# Patient Record
Sex: Female | Born: 2000 | Race: Black or African American | Hispanic: No | Marital: Single | Smoking: Never smoker
Health system: Southern US, Community
[De-identification: ages and names within clinical notes are randomized; demographics above are authoritative.]

## PROBLEM LIST (undated history)

## (undated) DIAGNOSIS — J189 Pneumonia, unspecified organism: Secondary | ICD-10-CM

## (undated) DIAGNOSIS — R011 Cardiac murmur, unspecified: Secondary | ICD-10-CM

## (undated) HISTORY — DX: Cardiac murmur, unspecified: R01.1

## (undated) HISTORY — PX: DILATION AND CURETTAGE OF UTERUS: SHX78

## (undated) HISTORY — DX: Pneumonia, unspecified organism: J18.9

## (undated) HISTORY — PX: MANDIBLE SURGERY: SHX707

---
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
## 2019-01-14 ENCOUNTER — Emergency Department (HOSPITAL_COMMUNITY)
Admission: EM | Admit: 2019-01-14 | Discharge: 2019-01-15 | Disposition: A | Discharge status: Home / Self Care | Payer: Medicaid Other | Attending: Emergency Medicine | Admitting: Emergency Medicine

## 2019-01-14 ENCOUNTER — Encounter (HOSPITAL_COMMUNITY): Payer: Self-pay

## 2019-01-14 DIAGNOSIS — K5000 Crohn's disease of small intestine without complications: Secondary | ICD-10-CM | POA: Insufficient documentation

## 2019-01-14 DIAGNOSIS — Z79899 Other long term (current) drug therapy: Secondary | ICD-10-CM | POA: Insufficient documentation

## 2019-01-14 DIAGNOSIS — R1084 Generalized abdominal pain: Secondary | ICD-10-CM | POA: Diagnosis present

## 2019-01-14 LAB — URINALYSIS, ROUTINE W REFLEX MICROSCOPIC
Bilirubin Urine: NEGATIVE
Glucose, UA: NEGATIVE mg/dL
Ketones, ur: NEGATIVE mg/dL
Leukocytes, UA: NEGATIVE
Nitrite: NEGATIVE
PH: 6 (ref 5.0–8.0)
Protein, ur: NEGATIVE mg/dL
SPECIFIC GRAVITY, URINE: 1.024 (ref 1.005–1.030)

## 2019-01-14 LAB — COMPREHENSIVE METABOLIC PANEL
ALBUMIN: 4.1 g/dL (ref 3.5–5.0)
ALT: 19 U/L (ref 0–44)
AST: 19 U/L (ref 15–41)
Alkaline Phosphatase: 61 U/L (ref 38–126)
Anion gap: 8 (ref 5–15)
BUN: 6 mg/dL (ref 6–20)
CHLORIDE: 108 mmol/L (ref 98–111)
CO2: 23 mmol/L (ref 22–32)
CREATININE: 0.57 mg/dL (ref 0.44–1.00)
Calcium: 9 mg/dL (ref 8.9–10.3)
GFR calc Af Amer: >60 mL/min (ref >60)
GLUCOSE: 83 mg/dL (ref 70–99)
POTASSIUM: 3.7 mmol/L (ref 3.5–5.1)
SODIUM: 139 mmol/L (ref 135–145)
Total Bilirubin: 0.8 mg/dL (ref 0.3–1.2)
Total Protein: 6.6 g/dL (ref 6.5–8.1)

## 2019-01-14 LAB — I-STAT BETA HCG BLOOD, ED (MC, WL, AP ONLY): I-stat hCG, quantitative: <5 m[IU]/mL (ref <5)

## 2019-01-14 LAB — CBC
HCT: 41.2 % (ref 36.0–46.0)
HEMOGLOBIN: 12.8 g/dL (ref 12.0–15.0)
MCH: 27.7 pg (ref 26.0–34.0)
MCHC: 31.1 g/dL (ref 30.0–36.0)
MCV: 89.2 fL (ref 80.0–100.0)
Platelets: 190 10*3/uL (ref 150–400)
RBC: 4.62 MIL/uL (ref 3.87–5.11)
RDW: 12.7 % (ref 11.5–15.5)
WBC: 5.2 10*3/uL (ref 4.0–10.5)
nRBC: 0 % (ref 0.0–0.2)

## 2019-01-14 LAB — LIPASE, BLOOD: LIPASE: 27 U/L (ref 11–51)

## 2019-01-14 MED ORDER — MORPHINE SULFATE (PF) 4 MG/ML IV SOLN
4.0000 mg | Freq: Once | INTRAVENOUS | Status: AC
  Administered 2019-01-14: 4 mg via INTRAVENOUS
  Filled 2019-01-14: qty 1

## 2019-01-14 MED ORDER — SODIUM CHLORIDE 0.9% FLUSH
3.0000 mL | Freq: Once | INTRAVENOUS | Status: AC
  Administered 2019-01-14: 3 mL via INTRAVENOUS

## 2019-01-14 MED ORDER — ONDANSETRON HCL 4 MG/2ML IJ SOLN
4.0000 mg | Freq: Once | INTRAMUSCULAR | Status: AC
  Administered 2019-01-14: 4 mg via INTRAVENOUS
  Filled 2019-01-14: qty 2

## 2019-01-14 NOTE — ED Triage Notes (Signed)
Patient complains of lower abdominal intermittent pain and vomiting for several weeks. Denies diarrhea. No distress on arrival, no vomiting

## 2019-01-14 NOTE — ED Notes (Signed)
Pt reports abd pains for 2 weeks. Pt states she does not have a PCP here in [place] and was [facility] who advised there was nothing they could do for her.

## 2019-01-14 NOTE — ED Provider Notes (Signed)
[facility] EMERGENCY DEPARTMENT Provider Note   CSN: [id number] Arrival date & time: 01/14/19  1454     History   Chief Complaint Chief Complaint  Patient presents with  . Abdominal Pain    HPI Kendra Young is a 18 y.o. female.  HPI Patient presents to the emergency department with generalized abdominal pain that started 1 week ago.  Patient states that she was feeling some discomfort about 2 weeks ago but then noted worsening pain 1 week ago.  The patient states that she was seen by her on campus [facility] and they did not have any answers.  Patient states she has had nausea and vomiting over the last 2 days.  Patient states that nothing seems to make the condition better or worse.  The patient denies chest pain, shortness of breath, headache,blurred vision, neck pain, fever, cough, weakness, numbness, dizziness, anorexia, edema,  diarrhea, rash, back pain, dysuria, hematemesis, bloody stool, near syncope, or syncope. History reviewed. No pertinent past medical history.  There are no active problems to display for this patient.   Past Surgical History:  Procedure Laterality Date  . DILATION AND CURETTAGE OF UTERUS    . MANDIBLE SURGERY       OB History   No obstetric history on file.      Home Medications    Prior to Admission medications   Medication Sig Start Date End Date Taking? Authorizing Provider  etonogestrel (NEXPLANON) 68 MG IMPL implant 1 each by Subdermal route once. November 2019   Yes [provider]    Family History No family history on file.  Social History Social History   Tobacco Use  . Smoking status: Never Smoker  . Smokeless tobacco: Never Used  Substance Use Topics  . Alcohol use: Never    Frequency: Never  . Drug use: Yes    Frequency: 2.0 times per week    Types: Marijuana     Allergies   Patient has no known allergies.   Review of Systems Review of Systems All other systems negative  except as documented in the HPI. All pertinent positives and negatives as reviewed in the HPI.  Physical Exam Updated Vital Signs BP 128/88   Pulse 74   Temp 98.7 F (37.1 C) (Oral)   Resp 16   SpO2 100%   Physical Exam Vitals signs and nursing note reviewed.  Constitutional:      General: She is not in acute distress.    Appearance: She is well-developed.  HENT:     Head: Normocephalic and atraumatic.  Eyes:     Pupils: Pupils are equal, round, and reactive to light.  Neck:     Musculoskeletal: Normal range of motion and neck supple.  Cardiovascular:     Rate and Rhythm: Normal rate and regular rhythm.     Heart sounds: Normal heart sounds. No murmur. No friction rub. No gallop.   Pulmonary:     Effort: Pulmonary effort is normal. No respiratory distress.     Breath sounds: Normal breath sounds. No wheezing.  Abdominal:     General: Bowel sounds are normal. There is no distension.     Palpations: Abdomen is soft.     Tenderness: There is generalized abdominal tenderness. There is no guarding or rebound.  Skin:    General: Skin is warm and dry.     Capillary Refill: Capillary refill takes less than 2 seconds.     Findings: No erythema or rash.  Neurological:     Mental Status: She is alert and oriented to person, place, and time.     Motor: No abnormal muscle tone.     Coordination: Coordination normal.  Psychiatric:        Behavior: Behavior normal.      ED Treatments / Results  Labs (all labs ordered are listed, but only abnormal results are displayed) Labs Reviewed  URINALYSIS, ROUTINE W REFLEX MICROSCOPIC - Abnormal; Notable for the following components:      Result Value   APPearance HAZY (*)    Hgb urine dipstick LARGE (*)    Bacteria, UA RARE (*)    All other components within normal limits  LIPASE, BLOOD  COMPREHENSIVE METABOLIC PANEL  CBC  I-STAT BETA HCG BLOOD, ED ([facility], [facility], [facility] ONLY)    EKG None  Radiology No results  found.  Procedures Procedures (including critical care time)  Medications Ordered in ED Medications  morphine 4 MG/ML injection 4 mg (has no administration in time range)  sodium chloride flush (NS) 0.9 % injection 3 mL (3 mLs Intravenous Given 01/14/19 2251)  ondansetron (ZOFRAN) injection 4 mg (4 mg Intravenous Given 01/14/19 2245)     Initial Impression / Assessment and Plan / ED Course  I have reviewed the triage vital signs and the nursing notes.  Pertinent labs & imaging results that were available during my care of the patient were reviewed by me and considered in my medical decision making (see chart for details).     Patient has diffuse abdominal pain on examination.  Will have a CT scan to evaluate for any further issues.  If this is negative we will treat her symptoms and refer to GI.  She has no definite vaginal discharge but states that she has had bleeding since she got Nexplanon implanted.  Final Clinical Impressions(s) / ED Diagnoses   Final diagnoses:  None    ED Discharge Orders    None       Kyra MangesLawyer, Ohanna Gassert, PA-C 01/14/19 2345    Arby BarrettePfeiffer, Marcy, MD 01/15/19 2003

## 2019-01-15 ENCOUNTER — Emergency Department (HOSPITAL_COMMUNITY): Payer: Medicaid Other

## 2019-01-15 ENCOUNTER — Encounter (HOSPITAL_COMMUNITY): Payer: Self-pay | Admitting: Radiology

## 2019-01-15 MED ORDER — DICYCLOMINE HCL 20 MG PO TABS: 20.0000 mg | ORAL_TABLET | Freq: Two times a day (BID) | ORAL | 0 refills | Status: DC | PRN

## 2019-01-15 MED ORDER — AMOXICILLIN-POT CLAVULANATE 875-125 MG PO TABS: 1.0000 | ORAL_TABLET | Freq: Two times a day (BID) | ORAL | 0 refills | Status: DC

## 2019-01-15 MED ORDER — DICYCLOMINE HCL 10 MG PO CAPS
10.0000 mg | ORAL_CAPSULE | Freq: Once | ORAL | Status: AC
  Administered 2019-01-15: 10 mg via ORAL
  Filled 2019-01-15: qty 1

## 2019-01-15 MED ORDER — IOHEXOL 300 MG/ML  SOLN
100.0000 mL | Freq: Once | INTRAMUSCULAR | Status: AC | PRN
  Administered 2019-01-15: 100 mL via INTRAVENOUS

## 2019-01-15 NOTE — Discharge Instructions (Signed)
Mild your CT scan showed some inflammation to the end of your small intestine.  This may be from infectious or inflammatory causes.  You have been started on Augmentin to try and manage any potential exposure infectious cause of symptoms.  Continue use of Zofran, you which you were previously prescribed, for management of nausea.  You may use Bentyl as prescribed for abdominal pain or cramping.  Drink plenty of liquids to prevent dehydration.  We advise follow-up with a gastroenterologist.

## 2019-01-15 NOTE — ED Provider Notes (Signed)
12:57 AM Patient care assumed from [facility], PA-C at change of shift.  Patient presenting for abdominal pain, pending imaging.  I have reviewed the patient's CT scan which shows findings of terminal ileitis without complication.  This may be infectious or inflammatory.  She has no known history of IBD.  Plan for referral to gastroenterology.  Will trial on a course of Augmentin in the interim.  Patient states that she was given Zofran by student health.  Will provide additional prescription for Bentyl to manage abdominal pain and cramping.  Advised continued hydration with return if symptoms worsen.  Patient discharged in stable condition with no unaddressed concerns.  Vitals:   01/14/19 1520 01/14/19 2237 01/14/19 2300 01/14/19 2330  BP: 115/71 128/88 118/81 121/74  Pulse: 65 74 62 86  Resp: 16 16    Temp: 98.7 F (37.1 C)     TempSrc: Oral     SpO2: 100% 100% 100% 100%   Results for orders placed or performed during the hospital encounter of 01/14/19  Lipase, blood  Result Value Ref Range   Lipase 27 11 - 51 U/L  Comprehensive metabolic panel  Result Value Ref Range   Sodium 139 135 - 145 mmol/L   Potassium 3.7 3.5 - 5.1 mmol/L   Chloride 108 98 - 111 mmol/L   CO2 23 22 - 32 mmol/L   Glucose, Bld 83 70 - 99 mg/dL   BUN 6 6 - 20 mg/dL   Creatinine, Ser 4.09 0.44 - 1.00 mg/dL   Calcium 9.0 8.9 - 81.1 mg/dL   Total Protein 6.6 6.5 - 8.1 g/dL   Albumin 4.1 3.5 - 5.0 g/dL   AST 19 15 - 41 U/L   ALT 19 0 - 44 U/L   Alkaline Phosphatase 61 38 - 126 U/L   Total Bilirubin 0.8 0.3 - 1.2 mg/dL   GFR calc non Af Amer >60 >60 mL/min   GFR calc Af Amer >60 >60 mL/min   Anion gap 8 5 - 15  CBC  Result Value Ref Range   WBC 5.2 4.0 - 10.5 K/uL   RBC 4.62 3.87 - 5.11 MIL/uL   Hemoglobin 12.8 12.0 - 15.0 g/dL   HCT 91.4 78.2 - 95.6 %   MCV 89.2 80.0 - 100.0 fL   MCH 27.7 26.0 - 34.0 pg   MCHC 31.1 30.0 - 36.0 g/dL   RDW 21.3 08.6 - 57.8 %   Platelets 190 150 - 400 K/uL   nRBC  0.0 0.0 - 0.2 %  Urinalysis, Routine w reflex microscopic  Result Value Ref Range   Color, Urine YELLOW YELLOW   APPearance HAZY (A) CLEAR   Specific Gravity, Urine 1.024 1.005 - 1.030   pH 6.0 5.0 - 8.0   Glucose, UA NEGATIVE NEGATIVE mg/dL   Hgb urine dipstick LARGE (A) NEGATIVE   Bilirubin Urine NEGATIVE NEGATIVE   Ketones, ur NEGATIVE NEGATIVE mg/dL   Protein, ur NEGATIVE NEGATIVE mg/dL   Nitrite NEGATIVE NEGATIVE   Leukocytes, UA NEGATIVE NEGATIVE   RBC / HPF 0-5 0 - 5 RBC/hpf   WBC, UA 0-5 0 - 5 WBC/hpf   Bacteria, UA RARE (A) NONE SEEN   Squamous Epithelial / LPF 6-10 0 - 5   Mucus PRESENT   I-Stat beta hCG blood, ED  Result Value Ref Range   I-stat hCG, quantitative <5.0 <5 mIU/mL   Comment 3           Ct Abdomen Pelvis W Contrast  Result Date: 01/15/2019 CLINICAL DATA:  Abdominal pain with nausea and vomiting EXAM: CT ABDOMEN AND PELVIS WITH CONTRAST TECHNIQUE: Multidetector CT imaging of the abdomen and pelvis was performed using the standard protocol following bolus administration of intravenous contrast. CONTRAST:  OMNIPAQUE IOHEXOL 300 MG/ML  SOLN COMPARISON:  None. FINDINGS: Lower chest: Lung bases demonstrate no acute consolidation or effusion. Normal heart size. Hepatobiliary: No focal liver abnormality is seen. No gallstones, gallbladder wall thickening, or biliary dilatation. Pancreas: Unremarkable. No pancreatic ductal dilatation or surrounding inflammatory changes. Spleen: Normal in size without focal abnormality. Adrenals/Urinary Tract: Adrenal glands are unremarkable. Kidneys are normal, without renal calculi, focal lesion, or hydronephrosis. Bladder is unremarkable. Stomach/Bowel: Stomach is within normal limits. No dilated small bowel. Mild terminal ileal and ileocecal region bowel wall thickening. Vascular/Lymphatic: No significant vascular findings are present. No enlarged abdominal or pelvic lymph nodes. Reproductive: Uterus and bilateral adnexa are  unremarkable. Other: Negative for free air or free fluid Musculoskeletal: No acute or significant osseous findings. IMPRESSION: 1. Thickening of the terminal ileum and ileocecal valve, suspect for terminal ileitis as may be seen with infection or inflammatory bowel disease. 2. Otherwise no CT evidence for acute intra-abdominal or pelvic abnormality Electronically Signed   By: Jasmine Pang M.D.   On: 01/15/2019 00:39      Antony Madura, PA-C 01/15/19 5009    Arby Barrette, MD 01/15/19 2002

## 2019-01-15 NOTE — ED Notes (Signed)
Patient verbalizes understanding of discharge instructions. Opportunity for questioning and answers were provided. Armband removed by staff, pt discharged from ED home via friend.

## 2019-01-21 ENCOUNTER — Encounter (HOSPITAL_COMMUNITY): Payer: Self-pay

## 2019-01-21 ENCOUNTER — Emergency Department (HOSPITAL_COMMUNITY)
Admission: EM | Admit: 2019-01-21 | Discharge: 2019-01-21 | Disposition: A | Discharge status: Home / Self Care | Payer: Medicaid Other | Attending: Emergency Medicine | Admitting: Emergency Medicine

## 2019-01-21 DIAGNOSIS — Z79899 Other long term (current) drug therapy: Secondary | ICD-10-CM | POA: Insufficient documentation

## 2019-01-21 DIAGNOSIS — K5000 Crohn's disease of small intestine without complications: Secondary | ICD-10-CM | POA: Insufficient documentation

## 2019-01-21 DIAGNOSIS — R1030 Lower abdominal pain, unspecified: Secondary | ICD-10-CM | POA: Diagnosis present

## 2019-01-21 LAB — CBC WITH DIFFERENTIAL/PLATELET
ABS IMMATURE GRANULOCYTES: 0.01 10*3/uL (ref 0.00–0.07)
Basophils Absolute: 0 10*3/uL (ref 0.0–0.1)
Basophils Relative: 1 %
Eosinophils Absolute: 0 10*3/uL (ref 0.0–0.5)
Eosinophils Relative: 1 %
HCT: 42.1 % (ref 36.0–46.0)
HEMOGLOBIN: 13.3 g/dL (ref 12.0–15.0)
IMMATURE GRANULOCYTES: 0 %
Lymphocytes Relative: 47 %
Lymphs Abs: 2.3 10*3/uL (ref 0.7–4.0)
MCH: 27.8 pg (ref 26.0–34.0)
MCHC: 31.6 g/dL (ref 30.0–36.0)
MCV: 87.9 fL (ref 80.0–100.0)
Monocytes Absolute: 0.2 10*3/uL (ref 0.1–1.0)
Monocytes Relative: 5 %
Neutro Abs: 2.2 10*3/uL (ref 1.7–7.7)
Neutrophils Relative %: 46 %
Platelets: 220 10*3/uL (ref 150–400)
RBC: 4.79 MIL/uL (ref 3.87–5.11)
RDW: 12.5 % (ref 11.5–15.5)
WBC: 4.8 10*3/uL (ref 4.0–10.5)
nRBC: 0 % (ref 0.0–0.2)

## 2019-01-21 MED ORDER — PREDNISONE 10 MG (21) PO TBPK: ORAL_TABLET | Freq: Every day | ORAL | 0 refills | Status: AC

## 2019-01-21 MED ORDER — DICYCLOMINE HCL 20 MG PO TABS: 20.0000 mg | ORAL_TABLET | Freq: Two times a day (BID) | ORAL | 0 refills | Status: DC

## 2019-01-21 MED ORDER — DICYCLOMINE HCL 10 MG PO CAPS
20.0000 mg | ORAL_CAPSULE | Freq: Once | ORAL | Status: AC
  Administered 2019-01-21: 20 mg via ORAL
  Filled 2019-01-21: qty 2

## 2019-01-21 MED ORDER — PREDNISONE 20 MG PO TABS
40.0000 mg | ORAL_TABLET | Freq: Once | ORAL | Status: AC
  Administered 2019-01-21: 40 mg via ORAL
  Filled 2019-01-21: qty 2

## 2019-01-21 NOTE — ED Triage Notes (Signed)
Pt reports continued lower abd pain, pt was seen here a couple weeks ago and was told she has inflammation in her bowels. Pt given pain medication and recommendation to follow up with Gi but states she is unable to see Gi until her primary can give her a referral but her primary is in [place]. Pt states she is out of her medication and the pain is worse.

## 2019-01-21 NOTE — Discharge Instructions (Signed)
Your testing today was very normal.  Please be aware that you will need to follow-up with a gastrointestinal specialist.  I will refer you to the person who is on-call today, this is [facility] gastroenterology, phone number [phone].  Please see the contact information below.  In the meantime please take the prednisone as prescribed over the next 12 days as well as dicyclomine, 20 mg every 6 hours only as needed.  Return to the emergency department for increasing pain, fever, vomiting or bloody stools.

## 2019-01-21 NOTE — ED Provider Notes (Signed)
[place] EMERGENCY DEPARTMENT Provider Note   CSN: [id number] Arrival date & time: 01/21/19  1104     History   Chief Complaint Chief Complaint  Patient presents with  . Abdominal Pain    HPI Kendra Young is a 18 y.o. female.  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  She had been worked up in the emergency department with a combination of lab work and a CT scan.  The CBC and the metabolic panel were totally normal with no leukocytosis or anemia, her CT scan did show what appeared to be terminal ileitis.  She was treated with a course of Augmentin and a course of dicyclomine which she reports helped her symptoms however now that she is out of those medications her symptoms have returned causing increasing pain in the lower belly.  Her appetite is been decreased but she is not vomiting, she has normal stools without blood or mucus.  They are formed.  She denies any associated fevers, swelling, rashes, headaches, blurred vision or any other symptoms.  She has not been able to see the gastroenterologist stating that she has not seen her family doctor who is in [place] to do this referral.  She is requesting additional evaluation and treatment for her ongoing discomfort.  History reviewed. No pertinent past medical history.  There are no active problems to display for this patient.   Past Surgical History:  Procedure Laterality Date  . DILATION AND CURETTAGE OF UTERUS    . MANDIBLE SURGERY       OB History   No obstetric history on file.      Home Medications    Prior to Admission medications   Medication Sig Start Date End Date Taking? Authorizing Provider  dicyclomine (BENTYL) 20 MG tablet Take 1 tablet (20 mg total) by mouth 2 (two) times daily. 01/21/19   Noemi Chapel, MD  etonogestrel (NEXPLANON) 68 MG  IMPL implant 1 each by Subdermal route once. November 2019    [provider]  predniSONE (STERAPRED UNI-PAK 21 TAB) 10 MG (21) TBPK tablet Take by mouth daily for 12 days. Take 6 tabs by mouth daily  for 2 days, then 5 tabs for 2 days, then 4 tabs for 2 days, then 3 tabs for 2 days, 2 tabs for 2 days, then 1 tab by mouth daily for 2 days 01/21/19 02/02/19  Noemi Chapel, MD    Family History No family history on file.  Social History Social History   Tobacco Use  . Smoking status: Never Smoker  . Smokeless tobacco: Never Used  Substance Use Topics  . Alcohol use: Never    Frequency: Never  . Drug use: Yes    Frequency: 2.0 times per week    Types: Marijuana     Allergies   Patient has no known allergies.   Review of Systems Review of Systems  All other systems reviewed and are negative.    Physical Exam Updated Vital Signs BP (!) 105/55 (BP Location: Right Arm)   Pulse 66   Temp 98.5 F (36.9 C) (Oral)   Resp 16   SpO2 100%   Physical Exam Vitals signs and nursing note reviewed.  Constitutional:      General: She is not in acute distress.    Appearance: She is well-developed.  HENT:  Head: Normocephalic and atraumatic.     Mouth/Throat:     Pharynx: No oropharyngeal exudate.  Eyes:     General: No scleral icterus.       Right eye: No discharge.        Left eye: No discharge.     Conjunctiva/sclera: Conjunctivae normal.     Pupils: Pupils are equal, round, and reactive to light.  Neck:     Musculoskeletal: Normal range of motion and neck supple.     Thyroid: No thyromegaly.     Vascular: No JVD.  Cardiovascular:     Rate and Rhythm: Normal rate and regular rhythm.     Heart sounds: Normal heart sounds. No murmur. No friction rub. No gallop.   Pulmonary:     Effort: Pulmonary effort is normal. No respiratory distress.     Breath sounds: Normal breath sounds. No wheezing or rales.  Abdominal:     General: Bowel sounds are normal. There is no  distension.     Palpations: Abdomen is soft. There is no mass.     Tenderness: There is abdominal tenderness ( mild SP and RLQ and LLQ ttp, no guarding, no upper abd ttp).     Comments: Non surgical abd  Musculoskeletal: Normal range of motion.        General: No tenderness.  Lymphadenopathy:     Cervical: No cervical adenopathy.  Skin:    General: Skin is warm and dry.     Findings: No erythema or rash.  Neurological:     Mental Status: She is alert.     Coordination: Coordination normal.  Psychiatric:        Behavior: Behavior normal.      ED Treatments / Results  Labs (all labs ordered are listed, but only abnormal results are displayed) Labs Reviewed  CBC WITH DIFFERENTIAL/PLATELET    EKG None  Radiology No results found.  Procedures Procedures (including critical care time)  Medications Ordered in ED Medications  dicyclomine (BENTYL) capsule 20 mg (20 mg Oral Given 01/21/19 1318)  predniSONE (DELTASONE) tablet 40 mg (40 mg Oral Given 01/21/19 1319)     Initial Impression / Assessment and Plan / ED Course  I have reviewed the triage vital signs and the nursing notes.  Pertinent labs & imaging results that were available during my care of the patient were reviewed by me and considered in my medical decision making (see chart for details).  Clinical Course as of Jan 21 1422  Tue Jan 21, 2019  1419 BC normal, no leukocytosis, patient will be discharged with medications as per the note below, blood pressure stable, abdomen nonsurgical, the absence of leukocytosis suggest that this is not a progressive or surgical illness.  I have again informed her that she needs to follow-up with GI, she is agreeable   [BM]    Clinical Course User Index [BM] Noemi Chapel, MD    The patient is well-appearing, she has no vaginal symptoms, no bleeding, no mucus however she is 18 years old with what appears to be terminal ileitis raising a significant suspicion for inflammatory  bowel disease.  I believe that a repeat CBC is in order to make sure she does not have a significant leukocytosis to suggest another etiology however she would likely benefit from a steroid taper, dicyclomine and again follow-up with gastroenterologist.  The patient is agreeable to this plan and has a nonsurgical abdomen.  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

## 2021-10-18 ENCOUNTER — Emergency Department (HOSPITAL_COMMUNITY)
Admission: EM | Admit: 2021-10-18 | Discharge: 2021-10-19 | Disposition: A | Discharge status: Home / Self Care | Payer: Medicaid Other | Attending: Emergency Medicine | Admitting: Emergency Medicine

## 2021-10-18 ENCOUNTER — Encounter (HOSPITAL_COMMUNITY): Payer: Self-pay | Admitting: Emergency Medicine

## 2021-10-18 ENCOUNTER — Other Ambulatory Visit: Payer: Self-pay

## 2021-10-18 DIAGNOSIS — N9489 Other specified conditions associated with female genital organs and menstrual cycle: Secondary | ICD-10-CM | POA: Insufficient documentation

## 2021-10-18 DIAGNOSIS — R110 Nausea: Secondary | ICD-10-CM | POA: Diagnosis not present

## 2021-10-18 DIAGNOSIS — Z5321 Procedure and treatment not carried out due to patient leaving prior to being seen by health care provider: Secondary | ICD-10-CM | POA: Insufficient documentation

## 2021-10-18 DIAGNOSIS — R109 Unspecified abdominal pain: Secondary | ICD-10-CM | POA: Insufficient documentation

## 2021-10-18 DIAGNOSIS — N938 Other specified abnormal uterine and vaginal bleeding: Secondary | ICD-10-CM | POA: Diagnosis not present

## 2021-10-18 DIAGNOSIS — R42 Dizziness and giddiness: Secondary | ICD-10-CM | POA: Diagnosis not present

## 2021-10-18 LAB — BASIC METABOLIC PANEL
Anion gap: 9 (ref 5–15)
BUN: <5 mg/dL — ABNORMAL LOW (ref 6–20)
CO2: 25 mmol/L (ref 22–32)
Calcium: 8.8 mg/dL — ABNORMAL LOW (ref 8.9–10.3)
Chloride: 104 mmol/L (ref 98–111)
Creatinine, Ser: 0.56 mg/dL (ref 0.44–1.00)
GFR, Estimated: >60 mL/min (ref >60)
Glucose, Bld: 83 mg/dL (ref 70–99)
Potassium: 3.7 mmol/L (ref 3.5–5.1)
Sodium: 138 mmol/L (ref 135–145)

## 2021-10-18 LAB — I-STAT BETA HCG BLOOD, ED (MC, WL, AP ONLY): I-stat hCG, quantitative: <5 m[IU]/mL (ref <5)

## 2021-10-18 LAB — CBC WITH DIFFERENTIAL/PLATELET
Abs Immature Granulocytes: 0.01 10*3/uL (ref 0.00–0.07)
Basophils Absolute: 0 10*3/uL (ref 0.0–0.1)
Basophils Relative: 0 %
Eosinophils Absolute: 0 10*3/uL (ref 0.0–0.5)
Eosinophils Relative: 1 %
HCT: 40.4 % (ref 36.0–46.0)
Hemoglobin: 13 g/dL (ref 12.0–15.0)
Immature Granulocytes: 0 %
Lymphocytes Relative: 30 %
Lymphs Abs: 2.6 10*3/uL (ref 0.7–4.0)
MCH: 28.5 pg (ref 26.0–34.0)
MCHC: 32.2 g/dL (ref 30.0–36.0)
MCV: 88.6 fL (ref 80.0–100.0)
Monocytes Absolute: 0.9 10*3/uL (ref 0.1–1.0)
Monocytes Relative: 10 %
Neutro Abs: 5.1 10*3/uL (ref 1.7–7.7)
Neutrophils Relative %: 59 %
Platelets: 275 10*3/uL (ref 150–400)
RBC: 4.56 MIL/uL (ref 3.87–5.11)
RDW: 12.3 % (ref 11.5–15.5)
WBC: 8.5 10*3/uL (ref 4.0–10.5)
nRBC: 0 % (ref 0.0–0.2)

## 2021-10-18 NOTE — ED Provider Notes (Signed)
Emergency Medicine Provider Triage Evaluation Note  Kendra Young , a 20 y.o. female  was evaluated in triage.  Pt complains of vaginal bleeding over the last 4 days.  She has Nexplanon and normally does not have her cycle.  She has not been sexually active over the last 2 months.  She reports passing some clots and is now having associated shortness of breath, nausea, and lightheadedness.  She denies any urinary complaints, vaginal discharge or foul odor, diarrhea.  Review of Systems  Positive:  Negative: See above   Physical Exam  BP 123/77   Pulse 86   Temp 99 F (37.2 C) (Oral)   Resp 15   Ht 5' (1.524 m)   Wt 47.6 kg   SpO2 100%   BMI 20.51 kg/m  Gen:   Awake, no distress   Resp:  Normal effort  MSK:   Moves extremities without difficulty  Other:  Abdomen is nontender to palpation.  Medical Decision Making  Medically screening exam initiated at 7:43 PM.  Appropriate orders placed.  Kendra Young was informed that the remainder of the evaluation will be completed by another provider, this initial triage assessment does not replace that evaluation, and the importance of remaining in the ED until their evaluation is complete.     Myna Bright [place], PA-C 10/18/21 1945    Lucrezia Starch, MD 10/21/21 [phone]

## 2021-10-18 NOTE — ED Notes (Signed)
Pt states she is leaving  

## 2021-10-18 NOTE — ED Triage Notes (Signed)
Reports very heavy bleeding X4 days.  She states she usually does not having bleeding due to her birthcontrol.  Pt reports nausea and lightheadedness.  No neuro symptoms at this time.  Some cramping at times.

## 2021-10-19 ENCOUNTER — Emergency Department (HOSPITAL_COMMUNITY)
Admission: EM | Admit: 2021-10-19 | Discharge: 2021-10-20 | Disposition: A | Discharge status: Home / Self Care | Payer: Medicaid Other | Attending: Student | Admitting: Student

## 2021-10-19 ENCOUNTER — Emergency Department (HOSPITAL_COMMUNITY): Payer: Medicaid Other

## 2021-10-19 ENCOUNTER — Encounter (HOSPITAL_COMMUNITY): Payer: Self-pay | Admitting: *Deleted

## 2021-10-19 ENCOUNTER — Other Ambulatory Visit: Payer: Self-pay

## 2021-10-19 DIAGNOSIS — J101 Influenza due to other identified influenza virus with other respiratory manifestations: Secondary | ICD-10-CM | POA: Insufficient documentation

## 2021-10-19 DIAGNOSIS — Z20822 Contact with and (suspected) exposure to covid-19: Secondary | ICD-10-CM | POA: Diagnosis not present

## 2021-10-19 DIAGNOSIS — R509 Fever, unspecified: Secondary | ICD-10-CM | POA: Diagnosis present

## 2021-10-19 LAB — COMPREHENSIVE METABOLIC PANEL
ALT: 14 U/L (ref 0–44)
AST: 17 U/L (ref 15–41)
Albumin: 3.5 g/dL (ref 3.5–5.0)
Alkaline Phosphatase: 71 U/L (ref 38–126)
Anion gap: 11 (ref 5–15)
BUN: 6 mg/dL (ref 6–20)
CO2: 25 mmol/L (ref 22–32)
Calcium: 9.1 mg/dL (ref 8.9–10.3)
Chloride: 99 mmol/L (ref 98–111)
Creatinine, Ser: 0.61 mg/dL (ref 0.44–1.00)
GFR, Estimated: >60 mL/min (ref >60)
Glucose, Bld: 123 mg/dL — ABNORMAL HIGH (ref 70–99)
Potassium: 3.6 mmol/L (ref 3.5–5.1)
Sodium: 135 mmol/L (ref 135–145)
Total Bilirubin: 0.8 mg/dL (ref 0.3–1.2)
Total Protein: 7.1 g/dL (ref 6.5–8.1)

## 2021-10-19 LAB — CBC WITH DIFFERENTIAL/PLATELET
Abs Immature Granulocytes: 0.04 10*3/uL (ref 0.00–0.07)
Basophils Absolute: 0 10*3/uL (ref 0.0–0.1)
Basophils Relative: 0 %
Eosinophils Absolute: 0 10*3/uL (ref 0.0–0.5)
Eosinophils Relative: 0 %
HCT: 41.4 % (ref 36.0–46.0)
Hemoglobin: 14 g/dL (ref 12.0–15.0)
Immature Granulocytes: 0 %
Lymphocytes Relative: 20 %
Lymphs Abs: 2.1 10*3/uL (ref 0.7–4.0)
MCH: 28.9 pg (ref 26.0–34.0)
MCHC: 33.8 g/dL (ref 30.0–36.0)
MCV: 85.5 fL (ref 80.0–100.0)
Monocytes Absolute: 0.7 10*3/uL (ref 0.1–1.0)
Monocytes Relative: 7 %
Neutro Abs: 7.9 10*3/uL — ABNORMAL HIGH (ref 1.7–7.7)
Neutrophils Relative %: 73 %
Platelets: 325 10*3/uL (ref 150–400)
RBC: 4.84 MIL/uL (ref 3.87–5.11)
RDW: 11.9 % (ref 11.5–15.5)
WBC: 10.8 10*3/uL — ABNORMAL HIGH (ref 4.0–10.5)
nRBC: 0 % (ref 0.0–0.2)

## 2021-10-19 LAB — RESP PANEL BY RT-PCR (FLU A&B, COVID) ARPGX2
Influenza A by PCR: POSITIVE — AB
Influenza B by PCR: NEGATIVE
SARS Coronavirus 2 by RT PCR: NEGATIVE

## 2021-10-19 LAB — LIPASE, BLOOD: Lipase: 22 U/L (ref 11–51)

## 2021-10-19 MED ORDER — ALBUTEROL SULFATE HFA 108 (90 BASE) MCG/ACT IN AERS
4.0000 | INHALATION_SPRAY | Freq: Once | RESPIRATORY_TRACT | Status: AC
  Administered 2021-10-20: 4 via RESPIRATORY_TRACT
  Filled 2021-10-19: qty 6.7

## 2021-10-19 MED ORDER — ACETAMINOPHEN 500 MG PO TABS
1000.0000 mg | ORAL_TABLET | Freq: Once | ORAL | Status: AC
  Administered 2021-10-20: 1000 mg via ORAL
  Filled 2021-10-19: qty 2

## 2021-10-19 MED ORDER — AEROCHAMBER PLUS FLO-VU LARGE MISC: 1.0000 | Freq: Once | Status: AC

## 2021-10-19 MED ORDER — ONDANSETRON 4 MG PO TBDP
4.0000 mg | ORAL_TABLET | Freq: Once | ORAL | Status: AC
  Administered 2021-10-20: 4 mg via ORAL
  Filled 2021-10-19: qty 1

## 2021-10-19 MED ORDER — IPRATROPIUM-ALBUTEROL 0.5-2.5 (3) MG/3ML IN SOLN: 5.0000 mL | Freq: Once | RESPIRATORY_TRACT | Status: DC

## 2021-10-19 NOTE — ED Provider Notes (Signed)
Emergency Medicine Provider Triage Evaluation Note  Kendra Young , a 20 y.o. female  was evaluated in triage.  Pt complains of nausea, cough, vomiting, body aches, fevers, headache.  She reports vomiting with nausea.    She has had one negative covid test since this started.   She also reports heavy menstrual cycle.  She has irregular cycles, but never one that irregular.    Review of Systems  Positive: Fevers, cough, nausea, vomiting. Sore throat.   Negative: Abdominal pain.   Physical Exam  BP 115/72 (BP Location: Right Arm)   Pulse 83   Temp (!) 103.1 F (39.5 C) (Oral)   Resp 16   Ht 5' (1.524 m)   Wt 47.6 kg   SpO2 99%   BMI 20.51 kg/m  Gen:   Awake, no distress   Resp:  Normal effort coughing,  MSK:   Moves extremities without difficulty  Other:  Normal speech.   Medical Decision Making  Medically screening exam initiated at 8:01 PM.  Appropriate orders placed.  Kendra Young was informed that the remainder of the evaluation will be completed by another provider, this initial triage assessment does not replace that evaluation, and the importance of remaining in the ED until their evaluation is complete.     Kendra Young 10/19/21 2007    Hayden Rasmussen, MD 10/20/21 1020

## 2021-10-19 NOTE — ED Triage Notes (Signed)
PT states headache, cough, vomiting, body aches, fevers x 1.5 weeks.  States headache is getting worse.  Initially, throat was sore, but no longer.  Also c/o heavy menstrual cycle x 5 days, though improved this am.

## 2021-10-19 NOTE — ED Provider Notes (Signed)
[facility] EMERGENCY DEPARTMENT Provider Note   CSN: [id number] Arrival date & time: 10/19/21  1819     History Chief Complaint  Patient presents with   URI    Kendra Young is a 20 y.o. female.  Patient presents to the emergency department with a chief complaint of fever, chills, body aches.  She reports associated nausea, vomiting.  She states that she has had a negative COVID test.  States that she feels short of breath and like her lungs are tight.  She states that she initially got sick about a week ago, symptoms improved, and then worsened over the weekend.  She denies any other associated symptoms.  Denies any successful treatments prior to arrival.  The history is provided by the patient. No language interpreter was used.      History reviewed. No pertinent past medical history.  There are no problems to display for this patient.   Past Surgical History:  Procedure Laterality Date   DILATION AND CURETTAGE OF UTERUS     MANDIBLE SURGERY       OB History   No obstetric history on file.     No family history on file.  Social History   Tobacco Use   Smoking status: Never   Smokeless tobacco: Never  Vaping Use   Vaping Use: Never used  Substance Use Topics   Alcohol use: Never   Drug use: Yes    Frequency: 2.0 times per week    Types: Marijuana    Home Medications Prior to Admission medications   Medication Sig Start Date End Date Taking? Authorizing Provider  dicyclomine (BENTYL) 20 MG tablet Take 1 tablet (20 mg total) by mouth 2 (two) times daily. 01/21/19   Noemi Chapel, MD  etonogestrel (NEXPLANON) 68 MG IMPL implant 1 each by Subdermal route once. November 2019    [provider]    Allergies    Patient has no known allergies.  Review of Systems   Review of Systems  All other systems reviewed and are negative.  Physical Exam Updated Vital Signs BP 115/72 (BP Location: Right Arm)   Pulse 83   Temp (!) 103.1 F  (39.5 C) (Oral)   Resp 16   Ht 5' (1.524 m)   Wt 47.6 kg   SpO2 99%   BMI 20.51 kg/m   Physical Exam Vitals and nursing note reviewed.  Constitutional:      General: She is not in acute distress.    Appearance: She is well-developed.  HENT:     Head: Normocephalic and atraumatic.  Eyes:     Conjunctiva/sclera: Conjunctivae normal.  Cardiovascular:     Rate and Rhythm: Normal rate and regular rhythm.     Heart sounds: No murmur heard. Pulmonary:     Effort: Pulmonary effort is normal. No respiratory distress.     Breath sounds: Wheezing present.  Abdominal:     Palpations: Abdomen is soft.     Tenderness: There is no abdominal tenderness.  Musculoskeletal:        General: Normal range of motion.     Cervical back: Neck supple.  Skin:    General: Skin is warm and dry.  Neurological:     Mental Status: She is alert and oriented to person, place, and time.  Psychiatric:        Mood and Affect: Mood normal.        Behavior: Behavior normal.    ED Results / Procedures / Treatments  Labs (all labs ordered are listed, but only abnormal results are displayed) Labs Reviewed  RESP PANEL BY RT-PCR (FLU A&B, COVID) ARPGX2 - Abnormal; Notable for the following components:      Result Value   Influenza A by PCR POSITIVE (*)    All other components within normal limits  COMPREHENSIVE METABOLIC PANEL - Abnormal; Notable for the following components:   Glucose, Bld 123 (*)    All other components within normal limits  CBC WITH DIFFERENTIAL/PLATELET - Abnormal; Notable for the following components:   WBC 10.8 (*)    Neutro Abs 7.9 (*)    All other components within normal limits  LIPASE, BLOOD  I-STAT BETA HCG BLOOD, ED ([facility], [facility], [facility] ONLY)    EKG None  Radiology DG Chest 2 View  Result Date: 10/19/2021 CLINICAL DATA:  Shortness of breath, cough EXAM: CHEST - 2 VIEW COMPARISON:  None. FINDINGS: Lungs are clear.  No pleural effusion or pneumothorax. The heart is normal in  size. Visualized osseous structures are within normal limits. IMPRESSION: Normal chest radiographs. Electronically Signed   By: Julian Hy M.D.   On: 10/19/2021 20:57    Procedures Procedures   Medications Ordered in ED Medications  ondansetron (ZOFRAN-ODT) disintegrating tablet 4 mg (has no administration in time range)  acetaminophen (TYLENOL) tablet 1,000 mg (has no administration in time range)  ipratropium-albuterol (DUONEB) 0.5-2.5 (3) MG/3ML nebulizer solution 5 mL (has no administration in time range)    ED Course  I have reviewed the triage vital signs and the nursing notes.  Pertinent labs & imaging results that were available during my care of the patient were reviewed by me and considered in my medical decision making (see chart for details).    MDM Rules/Calculators/A&P                           Patient here with flulike symptoms.  Also reports shortness of breath.  She does have significant wheezing on my exam.  We will give nebulizer treatment.  Noted to be febrile to 103.1 in triage.  She is not hypoxic nor tachycardic.  Overall, patient does not appear toxic, but does appear uncomfortable from flu symptoms.  Influenza A test is positive.  COVID and flu B-.  She is outside of the treatment for influenza with Tamiflu.  Will reassess after albuterol treatment.  Patient feels improved after albuterol.  Lung sounds are clear.  Will discharge home with Tessalon and inhaler.  Return precautions discussed. Final Clinical Impression(s) / ED Diagnoses Final diagnoses:  Influenza A    Rx / DC Orders ED Discharge Orders          Ordered    benzonatate (TESSALON) 100 MG capsule  2 times daily PRN        10/20/21 0036             Montine Circle, PA-C 10/20/21 0038    Kommor, [place], MD 10/24/21 0730

## 2021-10-20 MED ORDER — AEROCHAMBER PLUS FLO-VU LARGE MISC
Status: AC
  Administered 2021-10-20: 1
  Filled 2021-10-20: qty 1

## 2021-10-20 MED ORDER — BENZONATATE 100 MG PO CAPS: 100.0000 mg | ORAL_CAPSULE | Freq: Two times a day (BID) | ORAL | 0 refills | Status: DC | PRN

## 2022-05-11 IMAGING — CR DG CHEST 2V
2 series · 2 of 2 positions shown · non-contrast
Comparison: None.

CLINICAL DATA: Shortness of breath, cough

EXAM:
CHEST - 2 VIEW

[chest lat]
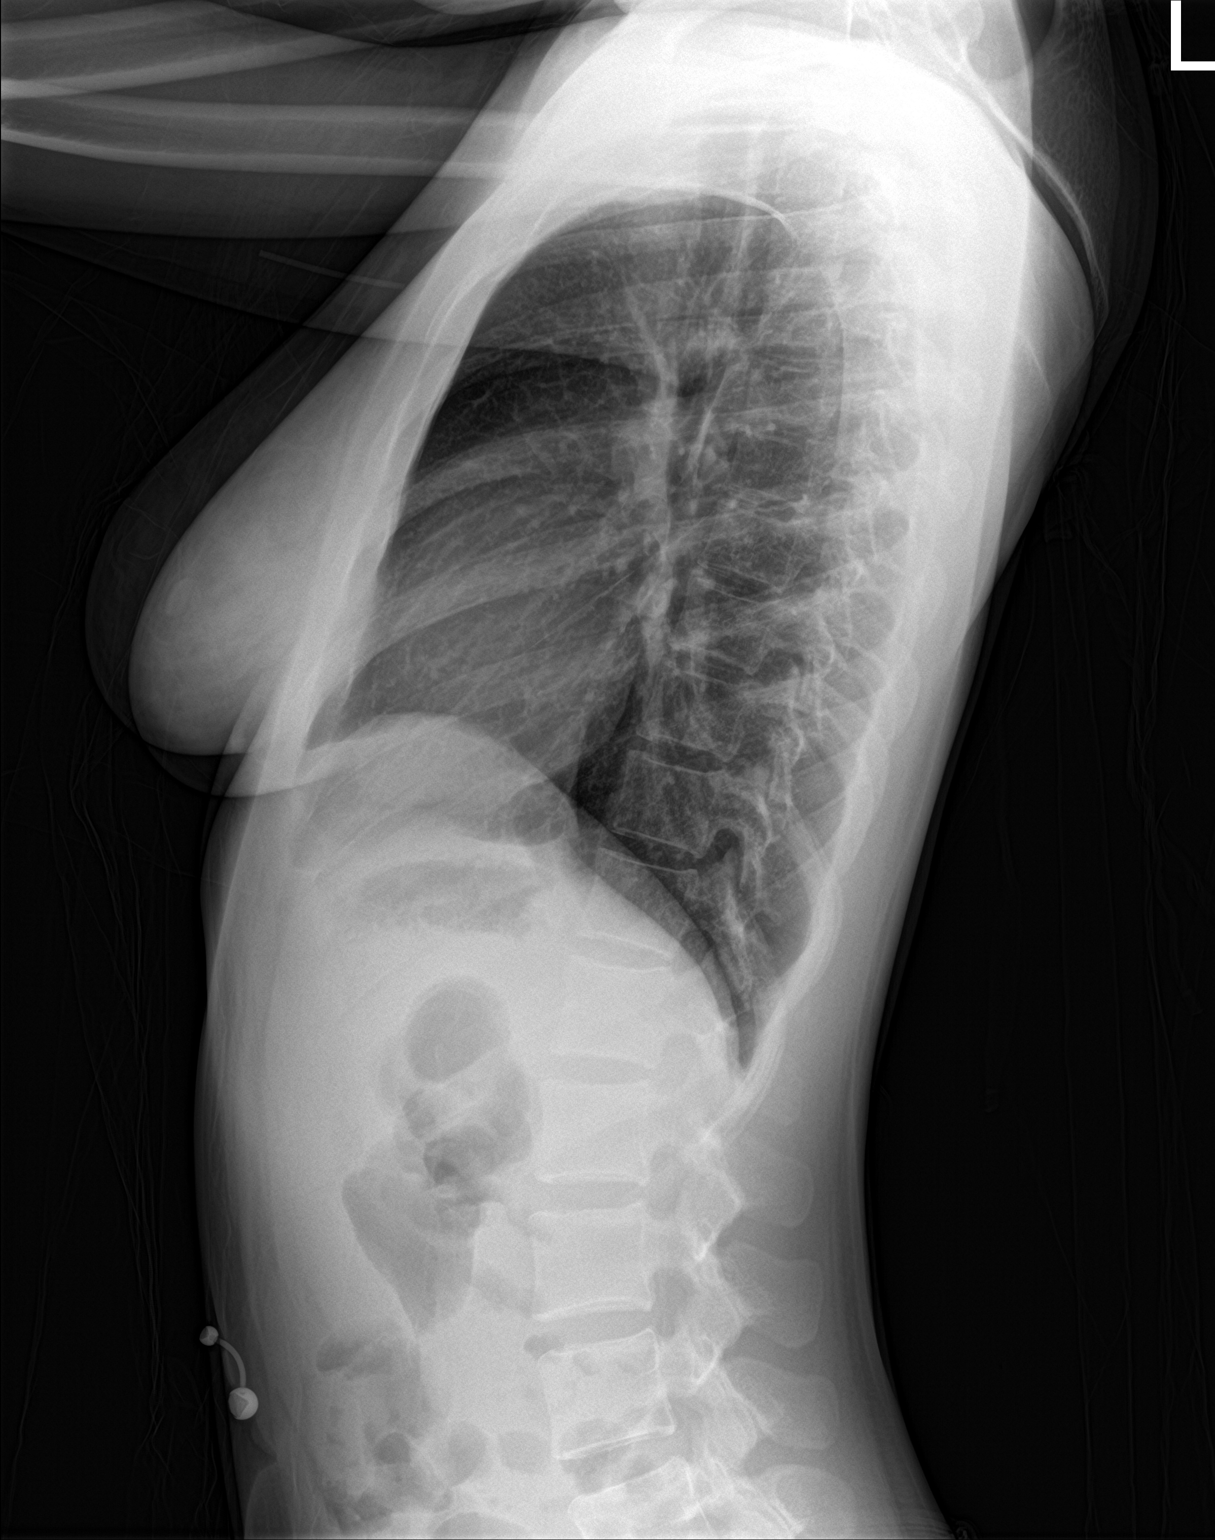

[chest pa]
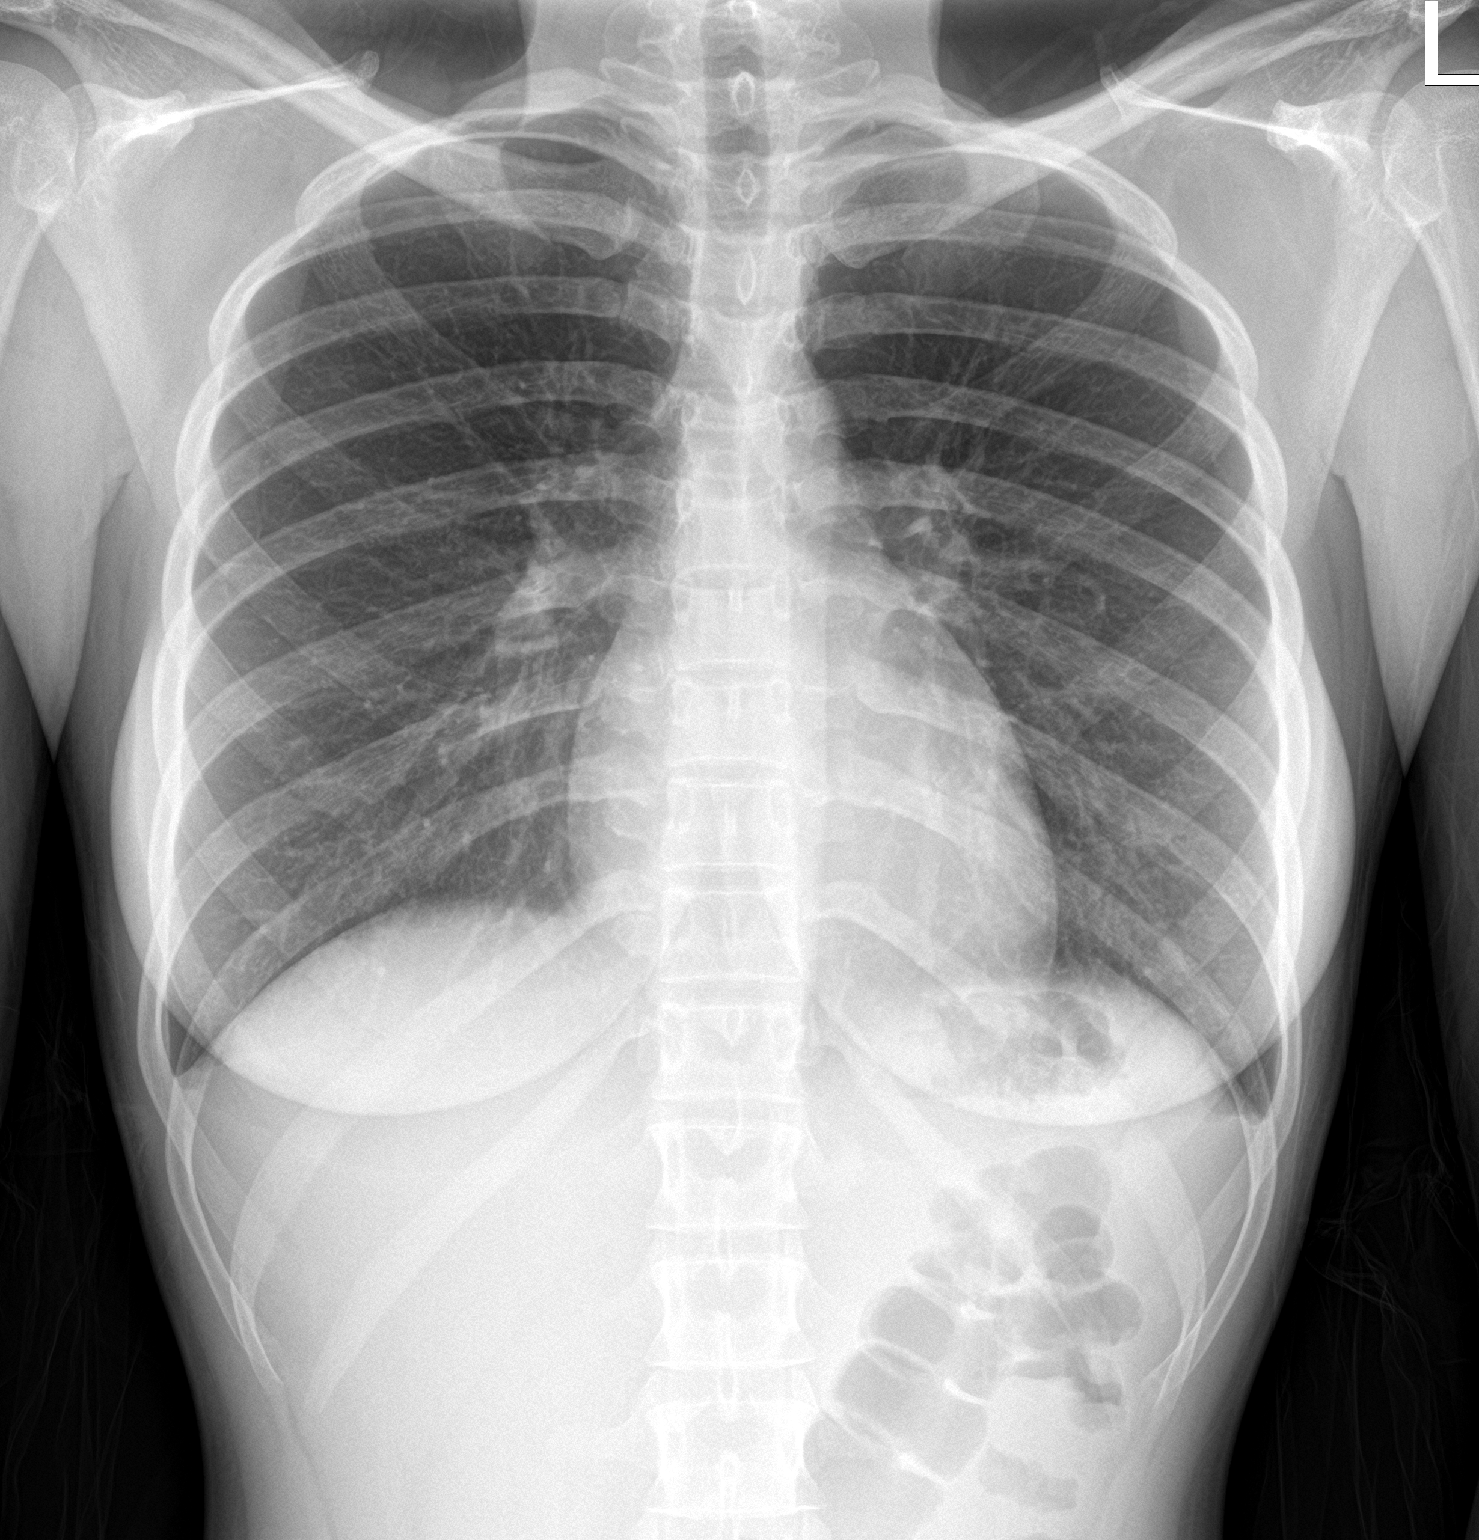

[2 of 2 positions shown; findings below may reference images not displayed]

FINDINGS: Lungs are clear.  No pleural effusion or pneumothorax.

The heart is normal in size.

Visualized osseous structures are within normal limits.
IMPRESSION: Normal chest radiographs.

## 2022-10-24 ENCOUNTER — Emergency Department (HOSPITAL_COMMUNITY): Payer: Medicaid Other

## 2022-10-24 ENCOUNTER — Encounter (HOSPITAL_COMMUNITY): Payer: Self-pay

## 2022-10-24 ENCOUNTER — Other Ambulatory Visit: Payer: Self-pay

## 2022-10-24 ENCOUNTER — Emergency Department (HOSPITAL_COMMUNITY)
Admission: EM | Admit: 2022-10-24 | Discharge: 2022-10-25 | Disposition: A | Discharge status: Home / Self Care | Payer: Medicaid Other | Attending: Emergency Medicine | Admitting: Emergency Medicine

## 2022-10-24 DIAGNOSIS — R519 Headache, unspecified: Secondary | ICD-10-CM | POA: Insufficient documentation

## 2022-10-24 DIAGNOSIS — R110 Nausea: Secondary | ICD-10-CM | POA: Diagnosis not present

## 2022-10-24 DIAGNOSIS — Y92410 Unspecified street and highway as the place of occurrence of the external cause: Secondary | ICD-10-CM | POA: Diagnosis not present

## 2022-10-24 DIAGNOSIS — R079 Chest pain, unspecified: Secondary | ICD-10-CM | POA: Diagnosis present

## 2022-10-24 LAB — CBC WITH DIFFERENTIAL/PLATELET
Abs Immature Granulocytes: 0 10*3/uL (ref 0.00–0.07)
Basophils Absolute: 0 10*3/uL (ref 0.0–0.1)
Basophils Relative: 1 %
Eosinophils Absolute: 0.1 10*3/uL (ref 0.0–0.5)
Eosinophils Relative: 2 %
HCT: 40.3 % (ref 36.0–46.0)
Hemoglobin: 13.2 g/dL (ref 12.0–15.0)
Immature Granulocytes: 0 %
Lymphocytes Relative: 56 %
Lymphs Abs: 3.1 10*3/uL (ref 0.7–4.0)
MCH: 29.4 pg (ref 26.0–34.0)
MCHC: 32.8 g/dL (ref 30.0–36.0)
MCV: 89.8 fL (ref 80.0–100.0)
Monocytes Absolute: 0.3 10*3/uL (ref 0.1–1.0)
Monocytes Relative: 5 %
Neutro Abs: 2 10*3/uL (ref 1.7–7.7)
Neutrophils Relative %: 36 %
Platelets: 230 10*3/uL (ref 150–400)
RBC: 4.49 MIL/uL (ref 3.87–5.11)
RDW: 12.9 % (ref 11.5–15.5)
WBC: 5.5 10*3/uL (ref 4.0–10.5)
nRBC: 0 % (ref 0.0–0.2)

## 2022-10-24 LAB — I-STAT BETA HCG BLOOD, ED (MC, WL, AP ONLY): I-stat hCG, quantitative: <5 m[IU]/mL (ref <5)

## 2022-10-24 LAB — BASIC METABOLIC PANEL
Anion gap: 7 (ref 5–15)
BUN: 8 mg/dL (ref 6–20)
CO2: 24 mmol/L (ref 22–32)
Calcium: 9 mg/dL (ref 8.9–10.3)
Chloride: 107 mmol/L (ref 98–111)
Creatinine, Ser: 0.65 mg/dL (ref 0.44–1.00)
GFR, Estimated: >60 mL/min (ref >60)
Glucose, Bld: 88 mg/dL (ref 70–99)
Potassium: 3.4 mmol/L — ABNORMAL LOW (ref 3.5–5.1)
Sodium: 138 mmol/L (ref 135–145)

## 2022-10-24 MED ORDER — IBUPROFEN 800 MG PO TABS
800.0000 mg | ORAL_TABLET | Freq: Once | ORAL | Status: AC
  Administered 2022-10-24: 800 mg via ORAL
  Filled 2022-10-24: qty 1

## 2022-10-24 MED ORDER — OXYCODONE-ACETAMINOPHEN 5-325 MG PO TABS
1.0000 | ORAL_TABLET | Freq: Once | ORAL | Status: AC
  Administered 2022-10-24: 1 via ORAL
  Filled 2022-10-24: qty 1
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
## 2022-10-25 ENCOUNTER — Emergency Department (HOSPITAL_COMMUNITY): Payer: Medicaid Other

## 2022-10-25 MED ORDER — ONDANSETRON HCL 4 MG PO TABS: 4.0000 mg | ORAL_TABLET | Freq: Four times a day (QID) | ORAL | 0 refills | Status: DC

## 2022-10-25 MED ORDER — IOHEXOL 300 MG/ML  SOLN
75.0000 mL | Freq: Once | INTRAMUSCULAR | Status: AC | PRN
  Administered 2022-10-25: 75 mL via INTRAVENOUS
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
## 2022-10-26 ENCOUNTER — Ambulatory Visit (INDEPENDENT_AMBULATORY_CARE_PROVIDER_SITE_OTHER): Payer: Medicaid Other | Admitting: Sports Medicine

## 2022-10-26 VITALS — Ht 61.0 in | Wt 108.0 lb

## 2022-10-26 DIAGNOSIS — M542 Cervicalgia: Secondary | ICD-10-CM

## 2022-10-26 DIAGNOSIS — R112 Nausea with vomiting, unspecified: Secondary | ICD-10-CM

## 2022-10-26 DIAGNOSIS — S060X0A Concussion without loss of consciousness, initial encounter: Secondary | ICD-10-CM

## 2022-10-26 MED ORDER — MELOXICAM 15 MG PO TABS: 15.0000 mg | ORAL_TABLET | Freq: Every day | ORAL | 0 refills | Status: DC

## 2022-10-26 NOTE — Patient Instructions (Addendum)
Relative mental and physical rest for 48 hours after concussive event -           Recommend light aerobic activity while keeping symptoms less than 3/10 -           Stop mental or physical activities that cause symptoms to worsen greater than 3/10, and wait 24 hours before attempting them again -           Eliminate screen time as much as possible for first 48 hours after concussive event, then continue limited screen time (recommend less than 2 hours per day) - Start meloxicam 15 mg daily x2 weeks.  If still having pain after 2 weeks, complete 3rd-week of meloxicam. May use remaining meloxicam as needed once daily for pain control.  Do not to use additional NSAIDs while taking meloxicam.  May use Tylenol [phone] mg 2 to 3 times a day for breakthrough pain. Neck HEP  Work note and school note  1 week follow up
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
## 2022-11-03 ENCOUNTER — Ambulatory Visit (INDEPENDENT_AMBULATORY_CARE_PROVIDER_SITE_OTHER): Payer: Medicaid Other | Admitting: Sports Medicine

## 2022-11-03 VITALS — HR 59 | Ht 61.0 in | Wt 108.0 lb

## 2022-11-03 DIAGNOSIS — H818X9 Other disorders of vestibular function, unspecified ear: Secondary | ICD-10-CM | POA: Diagnosis not present

## 2022-11-03 DIAGNOSIS — S060X0A Concussion without loss of consciousness, initial encounter: Secondary | ICD-10-CM | POA: Diagnosis not present

## 2022-11-03 DIAGNOSIS — M542 Cervicalgia: Secondary | ICD-10-CM | POA: Diagnosis not present

## 2022-11-03 DIAGNOSIS — G44319 Acute post-traumatic headache, not intractable: Secondary | ICD-10-CM

## 2022-11-03 DIAGNOSIS — R112 Nausea with vomiting, unspecified: Secondary | ICD-10-CM

## 2022-11-03 NOTE — Patient Instructions (Addendum)
Good to see you School note and work note  1-2 week follow up anytime
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
## 2022-11-14 ENCOUNTER — Ambulatory Visit (INDEPENDENT_AMBULATORY_CARE_PROVIDER_SITE_OTHER): Payer: Medicaid Other | Admitting: Sports Medicine

## 2022-11-14 VITALS — BP 120/80 | HR 57 | Ht 61.0 in | Wt 108.0 lb

## 2022-11-14 DIAGNOSIS — S060X0A Concussion without loss of consciousness, initial encounter: Secondary | ICD-10-CM | POA: Diagnosis not present

## 2022-11-14 DIAGNOSIS — H818X9 Other disorders of vestibular function, unspecified ear: Secondary | ICD-10-CM

## 2022-11-14 DIAGNOSIS — G44319 Acute post-traumatic headache, not intractable: Secondary | ICD-10-CM | POA: Diagnosis not present

## 2022-11-14 DIAGNOSIS — M545 Low back pain, unspecified: Secondary | ICD-10-CM | POA: Diagnosis not present

## 2022-11-14 NOTE — Patient Instructions (Addendum)
Good to see you  Low back HEP  Work note 4 hours max dec 4th 6 hours next 2 week  School note no major testing and can return to class as tolerated  2 week follow up

## 2022-11-22 ENCOUNTER — Other Ambulatory Visit: Payer: Self-pay | Admitting: Sports Medicine
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
## 2022-11-27 ENCOUNTER — Ambulatory Visit (INDEPENDENT_AMBULATORY_CARE_PROVIDER_SITE_OTHER): Payer: Medicaid Other | Admitting: Sports Medicine

## 2022-11-27 VITALS — BP 110/80 | HR 79 | Ht 61.0 in | Wt 107.0 lb

## 2022-11-27 DIAGNOSIS — S060X0A Concussion without loss of consciousness, initial encounter: Secondary | ICD-10-CM

## 2022-11-27 DIAGNOSIS — M545 Low back pain, unspecified: Secondary | ICD-10-CM

## 2022-11-27 DIAGNOSIS — M542 Cervicalgia: Secondary | ICD-10-CM

## 2022-11-27 DIAGNOSIS — H818X9 Other disorders of vestibular function, unspecified ear: Secondary | ICD-10-CM

## 2022-11-27 NOTE — Patient Instructions (Addendum)
Good to see you  School note no restrictions  Pt referral  Work note 6 hours max 1 week 12/04/2022 8 hours 3 week follow up
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
## 2022-12-06 ENCOUNTER — Other Ambulatory Visit: Payer: Self-pay

## 2022-12-06 ENCOUNTER — Ambulatory Visit: Payer: Medicaid Other | Attending: Sports Medicine

## 2022-12-06 DIAGNOSIS — M5459 Other low back pain: Secondary | ICD-10-CM | POA: Insufficient documentation

## 2022-12-06 DIAGNOSIS — M545 Low back pain, unspecified: Secondary | ICD-10-CM | POA: Diagnosis not present

## 2022-12-06 DIAGNOSIS — M542 Cervicalgia: Secondary | ICD-10-CM | POA: Diagnosis present
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
## 2022-12-15 ENCOUNTER — Ambulatory Visit: Payer: Medicaid Other

## 2022-12-15 DIAGNOSIS — M5459 Other low back pain: Secondary | ICD-10-CM | POA: Diagnosis not present

## 2022-12-15 DIAGNOSIS — M542 Cervicalgia: Secondary | ICD-10-CM
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
## 2022-12-27 ENCOUNTER — Ambulatory Visit (INDEPENDENT_AMBULATORY_CARE_PROVIDER_SITE_OTHER): Payer: Medicaid Other | Admitting: Sports Medicine

## 2022-12-27 VITALS — BP 110/78 | HR 60 | Ht 61.0 in | Wt 107.0 lb

## 2022-12-27 DIAGNOSIS — H818X9 Other disorders of vestibular function, unspecified ear: Secondary | ICD-10-CM

## 2022-12-27 DIAGNOSIS — M545 Low back pain, unspecified: Secondary | ICD-10-CM

## 2022-12-27 DIAGNOSIS — S060X0A Concussion without loss of consciousness, initial encounter: Secondary | ICD-10-CM

## 2022-12-27 DIAGNOSIS — M542 Cervicalgia: Secondary | ICD-10-CM

## 2022-12-27 DIAGNOSIS — G44319 Acute post-traumatic headache, not intractable: Secondary | ICD-10-CM

## 2022-12-27 MED ORDER — AMITRIPTYLINE HCL 10 MG PO TABS: 10.0000 mg | ORAL_TABLET | Freq: Every day | ORAL | 0 refills | Status: DC

## 2022-12-27 NOTE — Patient Instructions (Signed)
Good to see you Work note provided  6 hours maximum Start amitriptyline 10 mg nightly 1 week and if no negative side effects increase to 20 mg nightly  3 week follow up

## 2022-12-28 ENCOUNTER — Ambulatory Visit: Payer: Medicaid Other

## 2023-01-03 ENCOUNTER — Ambulatory Visit: Payer: Medicaid Other | Attending: Sports Medicine

## 2023-01-03 DIAGNOSIS — M5459 Other low back pain: Secondary | ICD-10-CM | POA: Diagnosis not present

## 2023-01-03 DIAGNOSIS — M542 Cervicalgia: Secondary | ICD-10-CM | POA: Insufficient documentation
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
## 2023-01-11 ENCOUNTER — Ambulatory Visit: Payer: Medicaid Other

## 2023-01-11 DIAGNOSIS — M5459 Other low back pain: Secondary | ICD-10-CM

## 2023-01-11 DIAGNOSIS — M542 Cervicalgia: Secondary | ICD-10-CM
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
## 2023-01-30 ENCOUNTER — Encounter: Payer: Medicaid Other | Admitting: Sports Medicine
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
## 2023-01-31 ENCOUNTER — Ambulatory Visit (INDEPENDENT_AMBULATORY_CARE_PROVIDER_SITE_OTHER): Payer: Medicaid Other | Admitting: Sports Medicine

## 2023-01-31 VITALS — BP 108/80 | Ht 61.0 in | Wt 104.0 lb

## 2023-01-31 DIAGNOSIS — H818X9 Other disorders of vestibular function, unspecified ear: Secondary | ICD-10-CM | POA: Diagnosis not present

## 2023-01-31 DIAGNOSIS — S060X0A Concussion without loss of consciousness, initial encounter: Secondary | ICD-10-CM

## 2023-01-31 DIAGNOSIS — G44319 Acute post-traumatic headache, not intractable: Secondary | ICD-10-CM

## 2023-01-31 DIAGNOSIS — M542 Cervicalgia: Secondary | ICD-10-CM

## 2023-01-31 DIAGNOSIS — G4700 Insomnia, unspecified: Secondary | ICD-10-CM

## 2023-01-31 NOTE — Patient Instructions (Addendum)
Good to see you  Recommend continue amitriptyline  10 mg daily thorough friday  Then recommend alternating 1 day medication 1 day off over 6 days  And then fully discontinuing medication Cleared to return to work and school no restrictions  2 week follow up

## 2023-02-08 ENCOUNTER — Ambulatory Visit: Payer: Medicaid Other | Attending: Sports Medicine

## 2023-02-08 DIAGNOSIS — M542 Cervicalgia: Secondary | ICD-10-CM

## 2023-02-08 DIAGNOSIS — M5459 Other low back pain: Secondary | ICD-10-CM | POA: Diagnosis present
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
## 2023-02-19 ENCOUNTER — Ambulatory Visit (INDEPENDENT_AMBULATORY_CARE_PROVIDER_SITE_OTHER): Payer: Medicaid Other | Admitting: Sports Medicine

## 2023-02-19 VITALS — BP 120/80 | HR 55 | Ht 61.0 in | Wt 106.0 lb

## 2023-02-19 DIAGNOSIS — S060X0D Concussion without loss of consciousness, subsequent encounter: Secondary | ICD-10-CM

## 2023-02-19 DIAGNOSIS — G44319 Acute post-traumatic headache, not intractable: Secondary | ICD-10-CM | POA: Diagnosis not present

## 2023-02-19 NOTE — Patient Instructions (Signed)
Good to see you   

## 2023-03-07 ENCOUNTER — Telehealth: Payer: Self-pay | Admitting: Sports Medicine

## 2023-03-07 NOTE — Telephone Encounter (Signed)
New school note sent via mychart
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
## 2023-05-30 ENCOUNTER — Ambulatory Visit (INDEPENDENT_AMBULATORY_CARE_PROVIDER_SITE_OTHER): Payer: Medicaid Other | Admitting: Obstetrics

## 2023-05-30 ENCOUNTER — Other Ambulatory Visit (HOSPITAL_COMMUNITY)
Admission: RE | Admit: 2023-05-30 | Discharge: 2023-05-30 | Disposition: A | Discharge status: Home / Self Care | Payer: Medicaid Other | Source: Ambulatory Visit | Attending: Obstetrics | Admitting: Obstetrics

## 2023-05-30 ENCOUNTER — Encounter: Payer: Self-pay | Admitting: Obstetrics

## 2023-05-30 VITALS — BP 104/66 | HR 66 | Ht 61.0 in | Wt 107.0 lb

## 2023-05-30 DIAGNOSIS — Z113 Encounter for screening for infections with a predominantly sexual mode of transmission: Secondary | ICD-10-CM

## 2023-05-30 DIAGNOSIS — N898 Other specified noninflammatory disorders of vagina: Secondary | ICD-10-CM | POA: Diagnosis present

## 2023-05-30 DIAGNOSIS — Z01419 Encounter for gynecological examination (general) (routine) without abnormal findings: Secondary | ICD-10-CM | POA: Insufficient documentation

## 2023-05-30 DIAGNOSIS — Z1339 Encounter for screening examination for other mental health and behavioral disorders: Secondary | ICD-10-CM

## 2023-05-30 NOTE — Progress Notes (Signed)
Subjective:        Kendra Young is a 22 y.o. female here for a routine exam.  Current complaints: Vaginal discharge .    Personal health questionnaire:  Is patient Ashkenazi Jewish, have a family history of breast and/or ovarian cancer: yes Is there a family history of uterine cancer diagnosed at age < 71, gastrointestinal cancer, urinary tract cancer, family member who is a Personnel officer syndrome-associated carrier: no Is the patient overweight and hypertensive, family history of diabetes, personal history of gestational diabetes, preeclampsia or PCOS: no Is patient over 42, have PCOS,  family history of premature CHD under age 2, diabetes, smoke, have hypertension or peripheral artery disease:  no At any time, has a partner hit, kicked or otherwise hurt or frightened you?: no Over the past 2 weeks, have you felt down, depressed or hopeless?: no Over the past 2 weeks, have you felt little interest or pleasure in doing things?:no   Gynecologic History Patient's last menstrual period was 05/05/2023 (exact date). Contraception: abstinence Last Pap: none. Results were: none Last mammogram: n/a. Results were: n/a  Obstetric History OB History  Gravida Para Term Preterm AB Living  2       2 0  SAB IAB Ectopic Multiple Live Births  2            # Outcome Date GA Lbr Len/2nd Weight Sex Delivery Anes PTL Lv  2 SAB           1 SAB             Past Medical History:  Diagnosis Date   Heart murmur    Pneumonia     Past Surgical History:  Procedure Laterality Date   DILATION AND CURETTAGE OF UTERUS     MANDIBLE SURGERY       Current Outpatient Medications:    amitriptyline (ELAVIL) 10 MG tablet, Take 1 tablet (10 mg total) by mouth at bedtime. (Patient not taking: Reported on 05/30/2023), Disp: 30 tablet, Rfl: 0   benzonatate (TESSALON) 100 MG capsule, Take 1 capsule (100 mg total) by mouth 2 (two) times daily as needed for cough. (Patient not taking: Reported on 05/30/2023), Disp:  20 capsule, Rfl: 0   dicyclomine (BENTYL) 20 MG tablet, Take 1 tablet (20 mg total) by mouth 2 (two) times daily. (Patient not taking: Reported on 05/30/2023), Disp: 20 tablet, Rfl: 0   etonogestrel (NEXPLANON) 68 MG IMPL implant, 1 each by Subdermal route once. November 2019 (Patient not taking: Reported on 05/30/2023), Disp: , Rfl:    meloxicam (MOBIC) 15 MG tablet, Take 1 tablet (15 mg total) by mouth daily. (Patient not taking: Reported on 05/30/2023), Disp: 30 tablet, Rfl: 0   ondansetron (ZOFRAN) 4 MG tablet, Take 1 tablet (4 mg total) by mouth every 6 (six) hours. (Patient not taking: Reported on 05/30/2023), Disp: 12 tablet, Rfl: 0 No Known Allergies  Social History   Tobacco Use   Smoking status: Never   Smokeless tobacco: Never  Substance Use Topics   Alcohol use: Yes    Comment: rarely    Family History  Problem Relation Age of Onset   Cancer Maternal Aunt    Cancer Maternal Uncle    Cancer Maternal Grandmother    Cancer Maternal Grandfather    Cancer Paternal Grandmother    Cancer Paternal Grandfather       Review of Systems  Constitutional: negative for fatigue and weight loss Respiratory: negative for cough and wheezing Cardiovascular: negative for chest  pain, fatigue and palpitations Gastrointestinal: negative for abdominal pain and change in bowel habits Musculoskeletal:negative for myalgias Neurological: negative for gait problems and tremors Behavioral/Psych: negative for abusive relationship, depression Endocrine: negative for temperature intolerance    Genitourinary: positive for vaginal discharge.  negative for abnormal menstrual periods, genital lesions, hot flashes, sexual problems  Integument/breast: negative for breast lump, breast tenderness, nipple discharge and skin lesion(s)    Objective:       BP 104/66   Pulse 66   Ht 5\' 1"  (1.549 m)   Wt 107 lb (48.5 kg)   LMP 05/05/2023 (Exact Date)   BMI 20.22 kg/m  General:   Alert and no distress   Skin:   no rash or abnormalities  Lungs:   clear to auscultation bilaterally  Heart:   regular rate and rhythm, S1, S2 normal, no murmur, click, rub or gallop  Breasts:   normal without suspicious masses, skin or nipple changes or axillary nodes  Abdomen:  normal findings: no organomegaly, soft, non-tender and no hernia  Pelvis:  External genitalia: normal general appearance Urinary system: urethral meatus normal and bladder without fullness, nontender Vaginal: normal without tenderness, induration or masses Cervix: normal appearance Adnexa: normal bimanual exam Uterus: anteverted and non-tender, normal size   Lab Review Urine pregnancy test Labs reviewed yes Radiologic studies reviewed no  I have spent a total of 20 minutes of face-to-face time, excluding clinical staff time, reviewing notes and preparing to see patient, ordering tests and/or medications, and counseling the patient.   Assessment:    1. Encounter for gynecological examination with Papanicolaou smear of cervix Rx: - Cytology - PAP( [place]) - CBC - Ferritin - Comprehensive metabolic panel - TSH  2. Vaginal discharge Rx: - Cervicovaginal ancillary only( [place])  3. Screen for STD (sexually transmitted disease) Rx: - HIV antibody (with reflex) - Hepatitis C Antibody - Hepatitis B Surface AntiGEN - RPR     Plan:    Education reviewed: calcium supplements, depression evaluation, low fat, low cholesterol diet, safe sex/STD prevention, self breast exams, and weight bearing exercise. Contraception: none. Follow up in: 1 year.    Orders Placed This Encounter  Procedures   HIV antibody (with reflex)   Hepatitis C Antibody   Hepatitis B Surface AntiGEN   RPR   CBC   Ferritin   Comprehensive metabolic panel   TSH     Brock Bad, MD 05/30/2023 11:43 AM

## 2023-05-30 NOTE — Progress Notes (Signed)
22 y.o. New GYN presents for AEX/PAP/STD screening.

## 2023-05-31 LAB — CERVICOVAGINAL ANCILLARY ONLY
Bacterial Vaginitis (gardnerella): NEGATIVE
Candida Glabrata: NEGATIVE
Candida Vaginitis: NEGATIVE
Chlamydia: NEGATIVE
Comment: NEGATIVE
Comment: NEGATIVE
Comment: NEGATIVE
Comment: NEGATIVE
Comment: NEGATIVE
Comment: NORMAL
Neisseria Gonorrhea: NEGATIVE
Trichomonas: NEGATIVE

## 2023-05-31 LAB — COMPREHENSIVE METABOLIC PANEL
ALT: 21 IU/L (ref 0–32)
AST: 14 IU/L (ref 0–40)
Albumin/Globulin Ratio: 1.9
Albumin: 4.6 g/dL (ref 4.0–5.0)
Alkaline Phosphatase: 84 IU/L (ref 44–121)
BUN/Creatinine Ratio: 14 (ref 9–23)
BUN: 11 mg/dL (ref 6–20)
Bilirubin Total: 0.3 mg/dL (ref 0.0–1.2)
CO2: 23 mmol/L (ref 20–29)
Calcium: 9.6 mg/dL (ref 8.7–10.2)
Chloride: 104 mmol/L (ref 96–106)
Creatinine, Ser: 0.78 mg/dL (ref 0.57–1.00)
Globulin, Total: 2.4 g/dL (ref 1.5–4.5)
Glucose: 83 mg/dL (ref 70–99)
Potassium: 3.9 mmol/L (ref 3.5–5.2)
Sodium: 141 mmol/L (ref 134–144)
Total Protein: 7 g/dL (ref 6.0–8.5)
eGFR: 109 mL/min/{1.73_m2} (ref >59)

## 2023-05-31 LAB — CBC
Hematocrit: 37.5 % (ref 34.0–46.6)
Hemoglobin: 12.5 g/dL (ref 11.1–15.9)
MCH: 28.4 pg (ref 26.6–33.0)
MCHC: 33.3 g/dL (ref 31.5–35.7)
MCV: 85 fL (ref 79–97)
Platelets: 254 10*3/uL (ref 150–450)
RBC: 4.4 x10E6/uL (ref 3.77–5.28)
RDW: 12.3 % (ref 11.7–15.4)
WBC: 12.4 10*3/uL — ABNORMAL HIGH (ref 3.4–10.8)

## 2023-05-31 LAB — TSH: TSH: 1.18 u[IU]/mL (ref 0.450–4.500)

## 2023-05-31 LAB — HIV ANTIBODY (ROUTINE TESTING W REFLEX): HIV Screen 4th Generation wRfx: NONREACTIVE

## 2023-05-31 LAB — HEPATITIS B SURFACE ANTIGEN: Hepatitis B Surface Ag: NEGATIVE

## 2023-05-31 LAB — RPR: RPR Ser Ql: NONREACTIVE

## 2023-05-31 LAB — FERRITIN: Ferritin: 27 ng/mL (ref 15–150)

## 2023-05-31 LAB — HEPATITIS C ANTIBODY: Hep C Virus Ab: NONREACTIVE

## 2023-06-01 LAB — CYTOLOGY - PAP: Diagnosis: NEGATIVE

## 2023-08-27 ENCOUNTER — Ambulatory Visit: Payer: Medicaid Other

## 2023-08-28 ENCOUNTER — Telehealth: Payer: Self-pay | Admitting: General Practice

## 2023-08-28 ENCOUNTER — Ambulatory Visit: Payer: Medicaid Other

## 2023-08-28 ENCOUNTER — Inpatient Hospital Stay (HOSPITAL_COMMUNITY)
Admission: AD | Admit: 2023-08-28 | Discharge: 2023-08-28 | Disposition: A | Discharge status: Home / Self Care | Payer: Medicaid Other | Attending: Obstetrics & Gynecology | Admitting: Obstetrics & Gynecology

## 2023-08-28 ENCOUNTER — Encounter (HOSPITAL_COMMUNITY): Payer: Self-pay | Admitting: Obstetrics & Gynecology

## 2023-08-28 ENCOUNTER — Inpatient Hospital Stay (HOSPITAL_COMMUNITY): Payer: Medicaid Other

## 2023-08-28 DIAGNOSIS — O4690 Antepartum hemorrhage, unspecified, unspecified trimester: Secondary | ICD-10-CM | POA: Diagnosis not present

## 2023-08-28 DIAGNOSIS — O209 Hemorrhage in early pregnancy, unspecified: Secondary | ICD-10-CM | POA: Diagnosis present

## 2023-08-28 DIAGNOSIS — O26891 Other specified pregnancy related conditions, first trimester: Secondary | ICD-10-CM | POA: Diagnosis not present

## 2023-08-28 DIAGNOSIS — Z3A01 Less than 8 weeks gestation of pregnancy: Secondary | ICD-10-CM | POA: Diagnosis not present

## 2023-08-28 DIAGNOSIS — O26899 Other specified pregnancy related conditions, unspecified trimester: Secondary | ICD-10-CM

## 2023-08-28 LAB — CBC
HCT: 35.4 % — ABNORMAL LOW (ref 36.0–46.0)
Hemoglobin: 12.8 g/dL (ref 12.0–15.0)
MCH: 31.6 pg (ref 26.0–34.0)
MCHC: 36.2 g/dL — ABNORMAL HIGH (ref 30.0–36.0)
MCV: 87.4 fL (ref 80.0–100.0)
Platelets: 176 10*3/uL (ref 150–400)
RBC: 4.05 MIL/uL (ref 3.87–5.11)
RDW: 17.2 % — ABNORMAL HIGH (ref 11.5–15.5)
WBC: 6.7 10*3/uL (ref 4.0–10.5)
nRBC: 0 % (ref 0.0–0.2)

## 2023-08-28 LAB — COMPREHENSIVE METABOLIC PANEL
ALT: 18 U/L (ref 0–44)
AST: 18 U/L (ref 15–41)
Albumin: 4.1 g/dL (ref 3.5–5.0)
Alkaline Phosphatase: 59 U/L (ref 38–126)
Anion gap: 10 (ref 5–15)
BUN: 11 mg/dL (ref 6–20)
CO2: 23 mmol/L (ref 22–32)
Calcium: 9.1 mg/dL (ref 8.9–10.3)
Chloride: 103 mmol/L (ref 98–111)
Creatinine, Ser: 0.59 mg/dL (ref 0.44–1.00)
GFR, Estimated: >60 mL/min (ref >60)
Glucose, Bld: 80 mg/dL (ref 70–99)
Potassium: 3.7 mmol/L (ref 3.5–5.1)
Sodium: 136 mmol/L (ref 135–145)
Total Bilirubin: 0.5 mg/dL (ref 0.3–1.2)
Total Protein: 7.3 g/dL (ref 6.5–8.1)

## 2023-08-28 LAB — URINALYSIS, ROUTINE W REFLEX MICROSCOPIC
Bilirubin Urine: NEGATIVE
Glucose, UA: NEGATIVE mg/dL
Hgb urine dipstick: NEGATIVE
Ketones, ur: 15 mg/dL — AB
Leukocytes,Ua: NEGATIVE
Nitrite: NEGATIVE
Protein, ur: NEGATIVE mg/dL
Specific Gravity, Urine: 1.025 (ref 1.005–1.030)
pH: 6 (ref 5.0–8.0)

## 2023-08-28 LAB — POCT PREGNANCY, URINE: Preg Test, Ur: POSITIVE — AB

## 2023-08-28 LAB — WET PREP, GENITAL
Clue Cells Wet Prep HPF POC: NONE SEEN
Sperm: NONE SEEN
Trich, Wet Prep: NONE SEEN
WBC, Wet Prep HPF POC: <10 (ref <10)
Yeast Wet Prep HPF POC: NONE SEEN

## 2023-08-28 LAB — HCG, QUANTITATIVE, PREGNANCY: hCG, Beta Chain, Quant, S: 61889 m[IU]/mL — ABNORMAL HIGH (ref <5)

## 2023-08-28 LAB — ABO/RH: ABO/RH(D): O POS

## 2023-08-28 NOTE — MAU Provider Note (Signed)
History     CSN: [id number]  Arrival date and time: 08/28/23 1506   Event Date/Time   First Provider Initiated Contact with Patient 08/28/23 1801      Chief Complaint  Patient presents with   Vaginal Bleeding    Kendra Young is a 22 y.o. G3P0020 at [redacted]w[redacted]d Definite LMP of Jul 20, 2023.  She presents today for vaginal bleeding and cramping.  Patient reports VB started this past week and was initially spotting.  She states it stopped and then was heavy with clots that was the size of quarter and dimes.  She reports she started having cramping around the same time that has been in the abdominal area.  She reports the pain has no relieving or aggravating factors. She rates the pain a 3-7/10 as it varies in intensity.    OB History     Gravida  3   Para      Term      Preterm      AB  2   Living  0      SAB  2   IAB      Ectopic      Multiple      Live Births              Past Medical History:  Diagnosis Date   Heart murmur    Pneumonia     Past Surgical History:  Procedure Laterality Date   DILATION AND CURETTAGE OF UTERUS     MANDIBLE SURGERY      Family History  Problem Relation Age of Onset   Cancer Maternal Aunt    Cancer Maternal Uncle    Cancer Maternal Grandmother    Cancer Maternal Grandfather    Cancer Paternal Grandmother    Cancer Paternal Grandfather     Social History   Tobacco Use   Smoking status: Never   Smokeless tobacco: Never  Vaping Use   Vaping status: Never Used  Substance Use Topics   Alcohol use: Yes    Comment: rarely   Drug use: Yes    Frequency: 2.0 times per week    Types: Marijuana    Allergies: No Known Allergies  Medications Prior to Admission  Medication Sig Dispense Refill Last Dose   amitriptyline (ELAVIL) 10 MG tablet Take 1 tablet (10 mg total) by mouth at bedtime. (Patient not taking: Reported on 05/30/2023) 30 tablet 0    benzonatate (TESSALON) 100 MG capsule Take 1 capsule (100 mg total) by  mouth 2 (two) times daily as needed for cough. (Patient not taking: Reported on 05/30/2023) 20 capsule 0    dicyclomine (BENTYL) 20 MG tablet Take 1 tablet (20 mg total) by mouth 2 (two) times daily. (Patient not taking: Reported on 05/30/2023) 20 tablet 0    etonogestrel (NEXPLANON) 68 MG IMPL implant 1 each by Subdermal route once. November 2019 (Patient not taking: Reported on 05/30/2023)      meloxicam (MOBIC) 15 MG tablet Take 1 tablet (15 mg total) by mouth daily. (Patient not taking: Reported on 05/30/2023) 30 tablet 0    ondansetron (ZOFRAN) 4 MG tablet Take 1 tablet (4 mg total) by mouth every 6 (six) hours. (Patient not taking: Reported on 05/30/2023) 12 tablet 0     Review of Systems  Gastrointestinal:  Positive for nausea. Negative for abdominal pain and vomiting.  Genitourinary:  Positive for vaginal bleeding. Negative for difficulty urinating and vaginal discharge.   Physical Exam   Blood pressure Marland Kitchen)  109/57, pulse 62, temperature 97.8 F (36.6 C), resp. rate 18, height 5\' 1"  (1.549 m), weight 50.8 kg, last menstrual period 07/20/2023.  Physical Exam Vitals reviewed.  Constitutional:      Appearance: Normal appearance.  HENT:     Head: Normocephalic and atraumatic.  Eyes:     Conjunctiva/sclera: Conjunctivae normal.  Cardiovascular:     Rate and Rhythm: Normal rate.  Pulmonary:     Effort: Pulmonary effort is normal.  Musculoskeletal:        General: Normal range of motion.     Cervical back: Normal range of motion.  Neurological:     Mental Status: She is alert and oriented to person, place, and time.  Psychiatric:        Mood and Affect: Mood normal.        Behavior: Behavior normal.     MAU Course  Procedures Results for orders placed or performed during the hospital encounter of 08/28/23 (from the past 24 hour(s))  Urinalysis, Routine w reflex microscopic -Urine, Clean Catch     Status: Abnormal   Collection Time: 08/28/23  3:38 PM  Result Value Ref Range    Color, Urine YELLOW YELLOW   APPearance CLEAR CLEAR   Specific Gravity, Urine 1.025 1.005 - 1.030   pH 6.0 5.0 - 8.0   Glucose, UA NEGATIVE NEGATIVE mg/dL   Hgb urine dipstick NEGATIVE NEGATIVE   Bilirubin Urine NEGATIVE NEGATIVE   Ketones, ur 15 (A) NEGATIVE mg/dL   Protein, ur NEGATIVE NEGATIVE mg/dL   Nitrite NEGATIVE NEGATIVE   Leukocytes,Ua NEGATIVE NEGATIVE  Wet prep, genital     Status: None   Collection Time: 08/28/23  3:38 PM   Specimen: PATH Cytology Cervicovaginal Ancillary Only  Result Value Ref Range   Yeast Wet Prep HPF POC NONE SEEN NONE SEEN   Trich, Wet Prep NONE SEEN NONE SEEN   Clue Cells Wet Prep HPF POC NONE SEEN NONE SEEN   WBC, Wet Prep HPF POC <10 <10   Sperm NONE SEEN   Pregnancy, urine POC     Status: Abnormal   Collection Time: 08/28/23  3:44 PM  Result Value Ref Range   Preg Test, Ur POSITIVE (A) NEGATIVE  CBC     Status: Abnormal   Collection Time: 08/28/23  4:31 PM  Result Value Ref Range   WBC 6.7 4.0 - 10.5 K/uL   RBC 4.05 3.87 - 5.11 MIL/uL   Hemoglobin 12.8 12.0 - 15.0 g/dL   HCT 29.5 (L) 62.1 - 30.8 %   MCV 87.4 80.0 - 100.0 fL   MCH 31.6 26.0 - 34.0 pg   MCHC 36.2 (H) 30.0 - 36.0 g/dL   RDW 65.7 (H) 84.6 - 96.2 %   Platelets 176 150 - 400 K/uL   nRBC 0.0 0.0 - 0.2 %  ABO/Rh     Status: None   Collection Time: 08/28/23  4:31 PM  Result Value Ref Range   ABO/RH(D) O POS    No rh immune globuloin      NOT A RH IMMUNE GLOBULIN CANDIDATE, PT RH POSITIVE Performed at [facility], [address], [place], [place] [address]   Comprehensive metabolic panel     Status: None   Collection Time: 08/28/23  4:31 PM  Result Value Ref Range   Sodium 136 135 - 145 mmol/L   Potassium 3.7 3.5 - 5.1 mmol/L   Chloride 103 98 - 111 mmol/L   CO2 23 22 - 32 mmol/L  Glucose, Bld 80 70 - 99 mg/dL   BUN 11 6 - 20 mg/dL   Creatinine, Ser 7.82 0.44 - 1.00 mg/dL   Calcium 9.1 8.9 - 95.6 mg/dL   Total Protein 7.3 6.5 - 8.1 g/dL   Albumin 4.1 3.5 -  5.0 g/dL   AST 18 15 - 41 U/L   ALT 18 0 - 44 U/L   Alkaline Phosphatase 59 38 - 126 U/L   Total Bilirubin 0.5 0.3 - 1.2 mg/dL   GFR, Estimated >21 >30 mL/min   Anion gap 10 5 - 15   US OB LESS THAN 14 WEEKS WITH OB TRANSVAGINAL  Result Date: 08/28/2023 CLINICAL DATA:  [id number] Vaginal bleeding [id number] EXAM: OBSTETRIC <14 WK [place] AND TRANSVAGINAL OB US TECHNIQUE: Ob ultrasound was performed for complete evaluation of the gestation as well as the maternal uterus, adnexal regions, and pelvic cul-de-sac. COMPARISON:  None Available. FINDINGS: Intrauterine gestational sac: Single Yolk sac:  Visualized. Embryo:  Not Visualized. MSD: 1.8 cm = 6 weeks 5 days [place] EDC: 04/17/2024 Subchorionic hemorrhage:  None visualized. Adnexa: No masses or fluid collections. IMPRESSION: 1. Intrauterine 6 week 5 day gestational sac with a yolk sac. 2. Fetal pole not identified. Consider follow up imaging to determine viability, if indicated. 3. No adnexal pathology. Electronically Signed   By: Layla Maw M.D.   On: 08/28/2023 17:19    MDM Cultures: Wet Prep and GC/CT Labs: UA, UPT, CBC, hCG, ABO Ultrasound Assessment and Plan  22 year old G3P0020 at 5.4 weeks Vaginal Bleeding Cramping  -Labs and [place] ordered from triage. -Results as above.  -Provider to bedside to discuss.  -Informed that findings are c/w early pregnancy, but additional [place] necessary to confirm viability.  -Order placed.  -Patient offered and declines pain medication.  -Bleeding precautions given.  -Encouraged to call primary office or return to MAU if symptoms worsen or with the onset of new symptoms. -Discharged to home in stable condition.   Cherre Robins MSN, CNM Advanced Practice Provider, Center for [facility] 08/28/2023, 6:01 PM

## 2023-08-28 NOTE — MAU Note (Signed)
.  Kendra Young is a 22 y.o. at Unknown here in MAU reporting: reports some vag bleeding and cramping that stared this week. Bleeding has gotten a little heavier and passing some clots. LMP: 07/20/2023 Onset of complaint: past week Pain score: 3-6 Vitals:   08/28/23 1532  BP: (!) 109/57  Pulse: 62  Resp: 18  Temp: 97.8 F (36.6 C)     FHT:n/a Lab orders placed from triage:

## 2023-08-28 NOTE — Telephone Encounter (Signed)
Patient called into front office to schedule appt. Reports 6 weeks by LMP and would like a sooner appt because she has been having bleeding. Patient states she has been spotting for the past week but a few days ago the bleeding has become a little more consistent at times with intermittent bad cramping. Recommended she go to MAU for further evaluation. Patient verbalized understanding.

## 2023-08-29 ENCOUNTER — Ambulatory Visit: Payer: Medicaid Other

## 2023-08-29 LAB — GC/CHLAMYDIA PROBE AMP (~~LOC~~) NOT AT ARMC
Chlamydia: NEGATIVE
Comment: NEGATIVE
Comment: NORMAL
Neisseria Gonorrhea: NEGATIVE

## 2023-09-17 ENCOUNTER — Ambulatory Visit (HOSPITAL_COMMUNITY): Admission: RE | Admit: 2023-09-17 | Payer: Medicaid Other | Source: Ambulatory Visit

## 2023-09-27 ENCOUNTER — Ambulatory Visit (HOSPITAL_COMMUNITY): Payer: Medicaid Other

## 2023-10-02 ENCOUNTER — Ambulatory Visit (HOSPITAL_COMMUNITY)
Admission: RE | Admit: 2023-10-02 | Discharge: 2023-10-02 | Disposition: A | Discharge status: Home / Self Care | Payer: Medicaid Other | Source: Ambulatory Visit

## 2023-10-02 DIAGNOSIS — O4690 Antepartum hemorrhage, unspecified, unspecified trimester: Secondary | ICD-10-CM | POA: Insufficient documentation

## 2023-10-02 DIAGNOSIS — Z3A01 Less than 8 weeks gestation of pregnancy: Secondary | ICD-10-CM | POA: Diagnosis present

## 2023-10-15 ENCOUNTER — Telehealth (INDEPENDENT_AMBULATORY_CARE_PROVIDER_SITE_OTHER): Payer: Medicaid Other

## 2023-10-15 DIAGNOSIS — Z3A01 Less than 8 weeks gestation of pregnancy: Secondary | ICD-10-CM

## 2023-10-15 DIAGNOSIS — O039 Complete or unspecified spontaneous abortion without complication: Secondary | ICD-10-CM

## 2023-10-15 NOTE — Telephone Encounter (Signed)
Kendra Young 05-13-2001 [id number]  Provider location: [facility]  Patient location: Home   Patient called and verified her identity via birth date and last 4 of her SSN.  Patient agreeable to results via phone and was informed of no IUP seen on most recent US study.  Patient states she had heavy bleeding with passing of large clots around Sept 15th.  She states she was not evaluated for the bleeding. She states she had bleeding for "weeks after" that didn't stop until before her [place] appt. She reports she had a normal cycle on Oct 15th.  She questions if she needs further evaluation. She does not desire pregnancy and is considering birth control options.  She states she was on Nexplanon and had to have surgical removal d/t placement.  She states she also had an IUD that was causing some discomfort d/t string length. She denies issues with bleeding on the IUD, but states she had issues with bleeding on the Nexplanon.  Patient also notes the Nexplanon was in for 5 years. Informed that she can contact office for scheduling for initiation of method as desired.  Information placed in AVS. No further questions or concerns.   Cherre Robins MSN, CNM Advanced Practice Provider, Center for [facility]   **This visit was completed, in its entirety, via telehealth communications.  I personally spent >/=7 minutes on the phone providing recommendations, education, and guidance.**

## 2023-12-17 ENCOUNTER — Other Ambulatory Visit (HOSPITAL_COMMUNITY)
Admission: RE | Admit: 2023-12-17 | Discharge: 2023-12-17 | Disposition: A | Discharge status: Home / Self Care | Payer: Medicaid Other | Source: Ambulatory Visit | Attending: Obstetrics | Admitting: Obstetrics

## 2023-12-17 ENCOUNTER — Encounter: Payer: Self-pay | Admitting: Obstetrics

## 2023-12-17 ENCOUNTER — Ambulatory Visit (INDEPENDENT_AMBULATORY_CARE_PROVIDER_SITE_OTHER): Payer: Medicaid Other | Admitting: Obstetrics

## 2023-12-17 VITALS — BP 109/72 | HR 67 | Ht 61.0 in | Wt 111.7 lb

## 2023-12-17 DIAGNOSIS — N898 Other specified noninflammatory disorders of vagina: Secondary | ICD-10-CM | POA: Diagnosis present

## 2023-12-17 DIAGNOSIS — N946 Dysmenorrhea, unspecified: Secondary | ICD-10-CM

## 2023-12-17 DIAGNOSIS — Z30011 Encounter for initial prescription of contraceptive pills: Secondary | ICD-10-CM

## 2023-12-17 DIAGNOSIS — E569 Vitamin deficiency, unspecified: Secondary | ICD-10-CM

## 2023-12-17 DIAGNOSIS — Z113 Encounter for screening for infections with a predominantly sexual mode of transmission: Secondary | ICD-10-CM | POA: Diagnosis not present

## 2023-12-17 DIAGNOSIS — Z3202 Encounter for pregnancy test, result negative: Secondary | ICD-10-CM | POA: Diagnosis not present

## 2023-12-17 DIAGNOSIS — Z3009 Encounter for other general counseling and advice on contraception: Secondary | ICD-10-CM

## 2023-12-17 LAB — POCT URINE PREGNANCY: Preg Test, Ur: NEGATIVE

## 2023-12-17 MED ORDER — NORGESTIMATE-ETH ESTRADIOL 0.25-35 MG-MCG PO TABS: 1.0000 | ORAL_TABLET | Freq: Every day | ORAL | 11 refills | Status: DC

## 2023-12-17 MED ORDER — IBUPROFEN 800 MG PO TABS
800.0000 mg | ORAL_TABLET | Freq: Three times a day (TID) | ORAL | 5 refills | Status: DC | PRN
Start: 2023-12-17 — End: 2024-02-27

## 2023-12-17 MED ORDER — VITAFOL ULTRA 29-0.6-0.4-200 MG PO CAPS
1.0000 | ORAL_CAPSULE | Freq: Every day | ORAL | 4 refills | Status: AC
Start: 2023-12-17 — End: ?

## 2023-12-17 NOTE — Progress Notes (Signed)
Subjective:    Kendra Young is a 22 y.o. female who presents for contraception counseling. The patient has no complaints today. The patient is sexually active. Pertinent past medical history: none.  The information documented in the HPI was reviewed and verified.  Menstrual History: OB History     Gravida  2   Para      Term      Preterm      AB  2   Living  0      SAB  2   IAB      Ectopic      Multiple      Live Births               Patient's last menstrual period was 11/25/2023.   There are no active problems to display for this patient.  Past Medical History:  Diagnosis Date   Heart murmur    Pneumonia     Past Surgical History:  Procedure Laterality Date   DILATION AND CURETTAGE OF UTERUS     MANDIBLE SURGERY       Current Outpatient Medications:    ibuprofen (ADVIL) 800 MG tablet, Take 1 tablet (800 mg total) by mouth every 8 (eight) hours as needed., Disp: 30 tablet, Rfl: 5   Prenat-Fe Poly-Methfol-FA-DHA (VITAFOL ULTRA) 29-0.6-0.4-200 MG CAPS, Take 1 capsule by mouth daily before breakfast., Disp: 34 each, Rfl: 4   norgestimate-ethinyl estradiol (ORTHO-CYCLEN) 0.25-35 MG-MCG tablet, Take 1 tablet by mouth daily., Disp: 28 tablet, Rfl: 11 No Known Allergies  Social History   Tobacco Use   Smoking status: Never   Smokeless tobacco: Never  Substance Use Topics   Alcohol use: Yes    Comment: rarely    Family History  Problem Relation Age of Onset   Cancer Maternal Aunt    Cancer Maternal Uncle    Cancer Maternal Grandmother    Cancer Maternal Grandfather    Cancer Paternal Grandmother    Cancer Paternal Grandfather        Review of Systems Constitutional: negative for weight loss Genitourinary: positive for vaginal discharge.  negative for abnormal menstrual periods    Objective:   BP 109/72   Pulse 67   Ht 5\' 1"  (1.549 m)   Wt 111 lb 11.2 oz (50.7 kg)   LMP 11/25/2023   Breastfeeding Unknown   BMI 21.11 kg/m     General:   Alert and no distress  Skin:   no rash or abnormalities  Lungs:   clear to auscultation bilaterally  Heart:   regular rate and rhythm, S1, S2 normal, no murmur, click, rub or gallop  Breasts:   normal without suspicious masses, skin or nipple changes or axillary nodes  Abdomen:  normal findings: no organomegaly, soft, non-tender and no hernia  Pelvis:  External genitalia: normal general appearance Urinary system: urethral meatus normal and bladder without fullness, nontender Vaginal: normal without tenderness, induration or masses Cervix: normal appearance Adnexa: normal bimanual exam Uterus: anteverted and non-tender, normal size   Lab Review Urine pregnancy test Labs reviewed yes Radiologic studies reviewed no  I have spent a total of 15 minutes of face-to-face time, excluding clinical staff time, reviewing notes and preparing to see patient, ordering tests and/or medications, and counseling the patient.   Assessment:    22 y.o., starting OCP (estrogen/progesterone), no contraindications.   Plan:   1. Encounter for other general counseling and advice on contraception (Primary) - discussed options - wants OCP;s  2. Encounter  for initial prescription of contraceptive pills Rx: - Cervicovaginal ancillary only( [place]) - POCT urine pregnancy - norgestimate-ethinyl estradiol (ORTHO-CYCLEN) 0.25-35 MG-MCG tablet; Take 1 tablet by mouth daily.  Dispense: 28 tablet; Refill: 11  3. Vaginal discharge Rx: - Cervicovaginal ancillary only( [place])  4. Screen for STD (sexually transmitted disease) Rx: - HIV antibody (with reflex) - RPR - Hepatitis B Surface AntiGEN - Hepatitis C Antibody  5. Dysmenorrhea Rx: - ibuprofen (ADVIL) 800 MG tablet; Take 1 tablet (800 mg total) by mouth every 8 (eight) hours as needed.  Dispense: 30 tablet; Refill: 5  6. Vitamin deficiency Rx: - Prenat-Fe Poly-Methfol-FA-DHA (VITAFOL ULTRA) 29-0.6-0.4-200 MG CAPS; Take 1  capsule by mouth daily before breakfast.  Dispense: 34 each; Refill: 4     All questions answered. Contraception: OCP (estrogen/progesterone). Discussed healthy lifestyle modifications. Follow up in 6 months. Pregnancy test, result: negative.  Meds ordered this encounter  Medications   DISCONTD: norgestimate-ethinyl estradiol (ORTHO-CYCLEN) 0.25-35 MG-MCG tablet    Sig: Take 1 tablet by mouth daily.    Dispense:  28 tablet    Refill:  11   ibuprofen (ADVIL) 800 MG tablet    Sig: Take 1 tablet (800 mg total) by mouth every 8 (eight) hours as needed.    Dispense:  30 tablet    Refill:  5   Prenat-Fe Poly-Methfol-FA-DHA (VITAFOL ULTRA) 29-0.6-0.4-200 MG CAPS    Sig: Take 1 capsule by mouth daily before breakfast.    Dispense:  34 each    Refill:  4   norgestimate-ethinyl estradiol (ORTHO-CYCLEN) 0.25-35 MG-MCG tablet    Sig: Take 1 tablet by mouth daily.    Dispense:  28 tablet    Refill:  11   Orders Placed This Encounter  Procedures   HIV antibody (with reflex)   RPR   Hepatitis B Surface AntiGEN   Hepatitis C Antibody   POCT urine pregnancy     Brock Bad, MD, FACOG Attending Obstetrician & Gynecologist, [facility] for [facility], [facility], [place] 12/17/2023

## 2023-12-17 NOTE — Progress Notes (Signed)
Pt is in the office to discuss [facility] options and STD testing. Pt desires pills for [facility] Last pap 05/30/2023

## 2023-12-18 LAB — CERVICOVAGINAL ANCILLARY ONLY
Bacterial Vaginitis (gardnerella): NEGATIVE
Candida Glabrata: NEGATIVE
Candida Vaginitis: NEGATIVE
Chlamydia: NEGATIVE
Comment: NEGATIVE
Comment: NEGATIVE
Comment: NEGATIVE
Comment: NEGATIVE
Comment: NEGATIVE
Comment: NORMAL
Neisseria Gonorrhea: NEGATIVE
Trichomonas: NEGATIVE

## 2023-12-24 LAB — HEPATITIS B SURFACE ANTIGEN: Hepatitis B Surface Ag: NEGATIVE

## 2023-12-24 LAB — RPR: RPR Ser Ql: NONREACTIVE

## 2023-12-24 LAB — HEPATITIS C ANTIBODY: Hep C Virus Ab: NONREACTIVE

## 2023-12-24 LAB — HIV ANTIBODY (ROUTINE TESTING W REFLEX): HIV Screen 4th Generation wRfx: NONREACTIVE

## 2024-02-22 ENCOUNTER — Other Ambulatory Visit (HOSPITAL_COMMUNITY)
Admission: RE | Admit: 2024-02-22 | Discharge: 2024-02-22 | Disposition: A | Discharge status: Home / Self Care | Source: Ambulatory Visit | Attending: Obstetrics & Gynecology | Admitting: Obstetrics & Gynecology

## 2024-02-22 ENCOUNTER — Ambulatory Visit

## 2024-02-22 VITALS — BP 117/72 | HR 62

## 2024-02-22 DIAGNOSIS — N898 Other specified noninflammatory disorders of vagina: Secondary | ICD-10-CM | POA: Insufficient documentation

## 2024-02-22 DIAGNOSIS — Z113 Encounter for screening for infections with a predominantly sexual mode of transmission: Secondary | ICD-10-CM

## 2024-02-22 NOTE — Progress Notes (Signed)
 SUBJECTIVE:  23 y.o. female who desires a STI screen. Denies abnormal vaginal discharge, bleeding or significant pelvic pain. No UTI symptoms. Denies history of known exposure to STD.  LMP not recorded.   OBJECTIVE:  She appears well.   ASSESSMENT:  STI Screen   PLAN:  Pt offered STI blood screening-requested GC, chlamydia, and trichomonas probe sent to lab.  Treatment: To be determined once lab results are received.  Pt follow up as needed.

## 2024-02-23 LAB — HEPATITIS B SURFACE ANTIGEN: Hepatitis B Surface Ag: NEGATIVE

## 2024-02-23 LAB — HEPATITIS C ANTIBODY: Hep C Virus Ab: NONREACTIVE

## 2024-02-23 LAB — RPR: RPR Ser Ql: NONREACTIVE

## 2024-02-23 LAB — HIV ANTIBODY (ROUTINE TESTING W REFLEX): HIV Screen 4th Generation wRfx: NONREACTIVE

## 2024-02-25 LAB — CERVICOVAGINAL ANCILLARY ONLY
Bacterial Vaginitis (gardnerella): NEGATIVE
Candida Glabrata: NEGATIVE
Candida Vaginitis: POSITIVE — AB
Chlamydia: NEGATIVE
Comment: NEGATIVE
Comment: NEGATIVE
Comment: NEGATIVE
Comment: NEGATIVE
Comment: NEGATIVE
Comment: NORMAL
Neisseria Gonorrhea: NEGATIVE
Trichomonas: NEGATIVE

## 2024-02-27 ENCOUNTER — Ambulatory Visit
Admission: EM | Admit: 2024-02-27 | Discharge: 2024-02-27 | Disposition: A | Discharge status: Home / Self Care | Attending: Family Medicine | Admitting: Family Medicine

## 2024-02-27 ENCOUNTER — Ambulatory Visit (INDEPENDENT_AMBULATORY_CARE_PROVIDER_SITE_OTHER)

## 2024-02-27 DIAGNOSIS — S63601A Unspecified sprain of right thumb, initial encounter: Secondary | ICD-10-CM

## 2024-02-27 DIAGNOSIS — M79644 Pain in right finger(s): Secondary | ICD-10-CM | POA: Diagnosis not present

## 2024-02-27 MED ORDER — NAPROXEN 500 MG PO TABS
500.0000 mg | ORAL_TABLET | Freq: Two times a day (BID) | ORAL | 0 refills | Status: DC
Start: 2024-02-27 — End: 2024-02-27

## 2024-02-27 MED ORDER — NAPROXEN 500 MG PO TABS
500.0000 mg | ORAL_TABLET | Freq: Two times a day (BID) | ORAL | 0 refills | Status: AC | PRN
Start: 2024-02-27 — End: ?

## 2024-02-27 NOTE — ED Provider Notes (Signed)
 [facility]    CSN: [id number] Arrival date & time: 02/27/24  1546      History   Chief Complaint Chief Complaint  Patient presents with   Hand Pain    HPI Kendra Young is a 23 y.o. female presents for thumb pain.  Patient reports 4 days of a right thumb pain and swelling.  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  She is right-handed.  Endorses some swelling and bruising to the proximal thumb but no numbness or tingling.  No swelling or pain to the rest of the hand or other digits.  No history of injuries or surgeries to this area.  She has been taking ibuprofen intermittently.  No other concerns at this time.   Hand Pain    Past Medical History:  Diagnosis Date   Heart murmur    Pneumonia     There are no active problems to display for this patient.   Past Surgical History:  Procedure Laterality Date   DILATION AND CURETTAGE OF UTERUS     MANDIBLE SURGERY      OB History     Gravida  2   Para      Term      Preterm      AB  2   Living  0      SAB  2   IAB      Ectopic      Multiple      Live Births               Home Medications    Prior to Admission medications   Medication Sig Start Date End Date Taking? Authorizing Provider  naproxen (NAPROSYN) 500 MG tablet Take 1 tablet (500 mg total) by mouth 2 (two) times daily as needed. 02/27/24   Radford Pax, NP  norgestimate-ethinyl estradiol (ORTHO-CYCLEN) 0.25-35 MG-MCG tablet Take 1 tablet by mouth daily. 12/17/23   Brock Bad, MD  Prenat-Fe Poly-Methfol-FA-DHA (VITAFOL ULTRA) 29-0.6-0.4-200 MG CAPS Take 1 capsule by mouth daily before breakfast. 12/17/23   Brock Bad, MD    Family History Family History  Problem Relation Age of Onset   Cancer Maternal Aunt    Cancer Maternal Uncle    Cancer Maternal Grandmother    Cancer Maternal Grandfather    Cancer Paternal Grandmother    Cancer Paternal  Grandfather     Social History Social History   Tobacco Use   Smoking status: Never   Smokeless tobacco: Never  Vaping Use   Vaping status: Never Used  Substance Use Topics   Alcohol use: Yes    Comment: rarely   Drug use: Yes    Frequency: 2.0 times per week    Types: Marijuana     Allergies   Patient has no known allergies.   Review of Systems Review of Systems  Musculoskeletal:        Right thumb pain      Physical Exam Triage Vital Signs ED Triage Vitals  Encounter Vitals Group     BP 02/27/24 1825 (!) 102/58     Systolic BP Percentile --      Diastolic BP Percentile --      Pulse Rate 02/27/24 1825 64     Resp 02/27/24 1825 17     Temp 02/27/24 1825 98.4 F (36.9 C)     Temp Source 02/27/24 1825 Oral  SpO2 02/27/24 1825 98 %     Weight --      Height --      Head Circumference --      Peak Flow --      Pain Score 02/27/24 1824 8     Pain Loc --      Pain Education --      Exclude from Growth Chart --    No data found.  Updated Vital Signs BP (!) 102/58 (BP Location: Left Arm)   Pulse 64   Temp 98.4 F (36.9 C) (Oral)   Resp 17   LMP 02/14/2024 (Exact Date)   SpO2 98%   Breastfeeding No   Visual Acuity Right Eye Distance:   Left Eye Distance:   Bilateral Distance:    Right Eye Near:   Left Eye Near:    Bilateral Near:     Physical Exam Vitals and nursing note reviewed.  Constitutional:      General: She is not in acute distress.    Appearance: Normal appearance. She is not ill-appearing.  HENT:     Head: Normocephalic and atraumatic.  Eyes:     Pupils: Pupils are equal, round, and reactive to light.  Cardiovascular:     Rate and Rhythm: Normal rate.  Pulmonary:     Effort: Pulmonary effort is normal.  Musculoskeletal:       Hands:     Comments: There is mild swelling of the proximal thumb/first metacarpal that does extend to the mid thumb.  Cap refill +2.  Reduced range of motion due to pain and swelling.  No tenderness  with palpation to 2nd through 5th metacarpals or fingers.  Skin:    General: Skin is warm and dry.  Neurological:     General: No focal deficit present.     Mental Status: She is alert and oriented to person, place, and time.  Psychiatric:        Mood and Affect: Mood normal.        Behavior: Behavior normal.      UC Treatments / Results  Labs (all labs ordered are listed, but only abnormal results are displayed) Labs Reviewed - No data to display  EKG   Radiology No results found.  Procedures Procedures (including critical care time)  Medications Ordered in UC Medications - No data to display  Initial Impression / Assessment and Plan / UC Course  I have reviewed the triage vital signs and the nursing notes.  Pertinent labs & imaging results that were available during my care of the patient were reviewed by me and considered in my medical decision making (see chart for details).     Reviewed exam and symptoms with patient.  No red flags.  Wet read of x-ray without fracture.  Will contact for any positive results based on radiology overread.  Discussed likely thumb sprain.  Patient placed in thumb spica and will do naproxen twice daily as needed.  Discussed RICE therapy.  PCP follow-up 2 to 3 days for recheck.  ER precautions reviewed and patient verbalized understanding Final Clinical Impressions(s) / UC Diagnoses   Final diagnoses:  Pain of right thumb  Sprain of right thumb, unspecified site of digit, initial encounter     Discharge Instructions      Use the thumb brace to help stabilize and support the thumb.  You may elevate and ice as needed.  Start naproxen twice daily for 7 days as needed.  Please follow-up with your PCP  in 2 to 3 days for recheck.  Please go to the ER for any worsening symptoms.  Hope you feel better soon!     ED Prescriptions     Medication Sig Dispense Auth. Provider   naproxen (NAPROSYN) 500 MG tablet  (Status: Discontinued) Take 1  tablet (500 mg total) by mouth 2 (two) times daily. 30 tablet Radford Pax, NP   naproxen (NAPROSYN) 500 MG tablet Take 1 tablet (500 mg total) by mouth 2 (two) times daily as needed. 14 tablet Radford Pax, NP      PDMP not reviewed this encounter.   Radford Pax, NP 02/27/24 1942

## 2024-02-27 NOTE — Discharge Instructions (Addendum)
 Use the thumb brace to help stabilize and support the thumb.  You may elevate and ice as needed.  Start naproxen twice daily for 7 days as needed.  Please follow-up with your PCP in 2 to 3 days for recheck.  Please go to the ER for any worsening symptoms.  Hope you feel better soon!

## 2024-02-27 NOTE — ED Triage Notes (Addendum)
 Pt present with c/o rt hand pain x 4 days. Pt states she is not sure if she injured her hand at work or not. Pt states she woke up one morning and had pain.  Pt states she has swelling and bruising, c/o limited ROM  Home interventions: Ibuprofen

## 2024-04-24 ENCOUNTER — Other Ambulatory Visit (HOSPITAL_COMMUNITY)
Admission: RE | Admit: 2024-04-24 | Discharge: 2024-04-24 | Disposition: A | Discharge status: Home / Self Care | Source: Ambulatory Visit | Attending: Obstetrics & Gynecology | Admitting: Obstetrics & Gynecology

## 2024-04-24 ENCOUNTER — Ambulatory Visit

## 2024-04-24 VITALS — BP 129/78 | HR 67 | Wt 118.7 lb

## 2024-04-24 DIAGNOSIS — N898 Other specified noninflammatory disorders of vagina: Secondary | ICD-10-CM | POA: Insufficient documentation

## 2024-04-24 DIAGNOSIS — Z3202 Encounter for pregnancy test, result negative: Secondary | ICD-10-CM

## 2024-04-24 DIAGNOSIS — Z3200 Encounter for pregnancy test, result unknown: Secondary | ICD-10-CM

## 2024-04-24 LAB — POCT URINE PREGNANCY: Preg Test, Ur: NEGATIVE

## 2024-04-24 NOTE — Progress Notes (Signed)
..  SUBJECTIVE:  23 y.o. female desires std screening. Pt also requests UPT because she sates she has missed some BC pills Denies abnormal vaginal bleeding or significant pelvic pain or fever. No UTI symptoms. Denies history of known exposure to STD.  No LMP recorded.  OBJECTIVE:  She appears well, afebrile. Urine dipstick: not done.  ASSESSMENT:  STD screening Negative UPT   PLAN:  GC, chlamydia, trichomonas, BVAG, CVAG probe sent to lab. Treatment: To be determined once lab results are received ROV prn if symptoms persist or worsen.

## 2024-04-25 LAB — CERVICOVAGINAL ANCILLARY ONLY
Bacterial Vaginitis (gardnerella): NEGATIVE
Candida Glabrata: NEGATIVE
Candida Vaginitis: NEGATIVE
Chlamydia: NEGATIVE
Comment: NEGATIVE
Comment: NEGATIVE
Comment: NEGATIVE
Comment: NEGATIVE
Comment: NEGATIVE
Comment: NORMAL
Neisseria Gonorrhea: NEGATIVE
Trichomonas: NEGATIVE

## 2024-07-18 DIAGNOSIS — L918 Other hypertrophic disorders of the skin: Secondary | ICD-10-CM | POA: Insufficient documentation

## 2024-08-28 ENCOUNTER — Ambulatory Visit (HOSPITAL_COMMUNITY)
Admission: RE | Admit: 2024-08-28 | Discharge: 2024-08-28 | Disposition: A | Discharge status: Home / Self Care | Source: Ambulatory Visit | Attending: Emergency Medicine | Admitting: Emergency Medicine

## 2024-08-28 ENCOUNTER — Encounter (HOSPITAL_COMMUNITY): Payer: Self-pay

## 2024-08-28 VITALS — BP 116/70 | HR 63 | Temp 98.9°F | Resp 18

## 2024-08-28 DIAGNOSIS — D229 Melanocytic nevi, unspecified: Secondary | ICD-10-CM

## 2024-08-28 NOTE — ED Triage Notes (Signed)
 Pt present left eye mold in the corner of her eye. Pt would like a referral to eye doctor.

## 2024-08-28 NOTE — ED Provider Notes (Signed)
 [facility]    CSN: [id number] Arrival date & time: 08/28/24  1805      History   Chief Complaint Chief Complaint  Patient presents with   Eye Problem    HPI Kendra Young is a 23 y.o. female.   Patient presents with concerns for a mole to the inner corner of her left eye.  Patient states that it has changed in shape and size and is therefore concerned about it.  Patient states that she did call a dermatologist and was told that she needed a referral to be seen and was told that she could come to urgent care for this referral.  Patient denies any pain or drainage from the area.  The history is provided by the patient and medical records.  Eye Problem   Past Medical History:  Diagnosis Date   Heart murmur    Pneumonia     There are no active problems to display for this patient.   Past Surgical History:  Procedure Laterality Date   DILATION AND CURETTAGE OF UTERUS     MANDIBLE SURGERY      OB History     Gravida  2   Para      Term      Preterm      AB  2   Living  0      SAB  2   IAB      Ectopic      Multiple      Live Births               Home Medications    Prior to Admission medications   Medication Sig Start Date End Date Taking? Authorizing Provider  naproxen  (NAPROSYN ) 500 MG tablet Take 1 tablet (500 mg total) by mouth 2 (two) times daily as needed. Patient not taking: Reported on 04/24/2024 02/27/24   Mayer, Jodi R, NP  norgestimate -ethinyl estradiol  (ORTHO-CYCLEN) 0.25-35 MG-MCG tablet Take 1 tablet by mouth daily. 12/17/23   Rudy Carlin LABOR, MD  Prenat-Fe Poly-Methfol-FA-DHA (VITAFOL  ULTRA) 29-0.6-0.4-200 MG CAPS Take 1 capsule by mouth daily before breakfast. Patient not taking: Reported on 04/24/2024 12/17/23   Rudy Carlin LABOR, MD    Family History Family History  Problem Relation Age of Onset   Cancer Maternal Aunt    Cancer Maternal Uncle    Cancer Maternal Grandmother    Cancer Maternal Grandfather     Cancer Paternal Grandmother    Cancer Paternal Grandfather     Social History Social History   Tobacco Use   Smoking status: Never   Smokeless tobacco: Never  Vaping Use   Vaping status: Never Used  Substance Use Topics   Alcohol use: Yes    Comment: rarely   Drug use: Yes    Frequency: 2.0 times per week    Types: Marijuana     Allergies   Patient has no known allergies.   Review of Systems Review of Systems  Per HPI  Physical Exam Triage Vital Signs ED Triage Vitals  Encounter Vitals Group     BP 08/28/24 1844 116/70     Girls Systolic BP Percentile --      Girls Diastolic BP Percentile --      Boys Systolic BP Percentile --      Boys Diastolic BP Percentile --      Pulse Rate 08/28/24 1844 63     Resp 08/28/24 1844 18     Temp 08/28/24 1844 98.9 F (37.2  C)     Temp Source 08/28/24 1844 Oral     SpO2 08/28/24 1844 96 %     Weight --      Height --      Head Circumference --      Peak Flow --      Pain Score 08/28/24 1843 5     Pain Loc --      Pain Education --      Exclude from Growth Chart --    No data found.  Updated Vital Signs BP 116/70 (BP Location: Left Arm)   Pulse 63   Temp 98.9 F (37.2 C) (Oral)   Resp 18   LMP 08/28/2024   SpO2 96%   Visual Acuity Right Eye Distance:   Left Eye Distance:   Bilateral Distance:    Right Eye Near:   Left Eye Near:    Bilateral Near:     Physical Exam Vitals and nursing note reviewed.  Constitutional:      General: She is awake. She is not in acute distress.    Appearance: Normal appearance. She is well-developed and well-groomed. She is not ill-appearing.  Eyes:      Comments: Small skin tag noted to inner corner of left eye.  Neurological:     Mental Status: She is alert.  Psychiatric:        Behavior: Behavior is cooperative.      UC Treatments / Results  Labs (all labs ordered are listed, but only abnormal results are displayed) Labs Reviewed - No data to  display  EKG   Radiology No results found.  Procedures Procedures (including critical care time)  Medications Ordered in UC Medications - No data to display  Initial Impression / Assessment and Plan / UC Course  I have reviewed the triage vital signs and the nursing notes.  Pertinent labs & imaging results that were available during my care of the patient were reviewed by me and considered in my medical decision making (see chart for details).     Patient is overall well-appearing.  Vitals are stable.  Provided patient with ambulatory referral to dermatology as requested.  Discussed follow-up and return precautions. Final Clinical Impressions(s) / UC Diagnoses   Final diagnoses:  Change in mole     Discharge Instructions      I provided you with an ambulatory referral to Dr. Delon Lenis who is a dermatologist. If you do not hear from them within the week you can call to schedule an appointment with them.   ED Prescriptions   None    PDMP not reviewed this encounter.   Johnie Flaming A, NP 08/28/24 1904

## 2024-08-28 NOTE — Discharge Instructions (Signed)
 I provided you with an ambulatory referral to Dr. Delon Lenis who is a dermatologist. If you do not hear from them within the week you can call to schedule an appointment with them.

## 2024-10-29 ENCOUNTER — Ambulatory Visit: Admitting: Dermatology

## 2024-10-29 ENCOUNTER — Encounter: Payer: Self-pay | Admitting: Dermatology

## 2024-10-29 VITALS — BP 109/79

## 2024-10-29 DIAGNOSIS — B078 Other viral warts: Secondary | ICD-10-CM | POA: Diagnosis not present

## 2024-10-29 DIAGNOSIS — B079 Viral wart, unspecified: Secondary | ICD-10-CM

## 2024-10-29 NOTE — Progress Notes (Signed)
   New Patient Visit   Subjective  Kendra Young is a 23 y.o. female who presents for a NEW PATIENT appointment to be examined for the concerns as listed below.   Spot Check: Located at the L inner eye corner. She stated it presented 4-57mo ago. Are is painful to touch - 6/10. She seen PCP in regard and was given referral dermatology. She has been applying OTC tea tree oil once or twice a day for 1-34mo and she has seen a decrease in size. However she restarted applying tea tree oil 2 weeks ago and restarted applying tea tree oil.    Are you nursing, pregnant or trying to conceive? No   The following portions of the chart were reviewed this encounter and updated as appropriate: medications, allergies, medical history  Review of Systems:  No other skin or systemic complaints except as noted in HPI or Assessment and Plan.  Objective  Well appearing patient in no apparent distress; mood and affect are within normal limits.   A focused examination was performed of the following areas: L eye   Relevant exam findings are noted in the Assessment and Plan.       Left Medial Canthus Verrucous papules   Assessment & Plan   FILIFORM WART Exam: verrucous papule at L inner eye corner  Treatment Plan: - Cryotherapy performed with liquid nitrogen  FILIFORM WART Left Medial Canthus Destruction of lesion - Left Medial Canthus Complexity: simple   Destruction method: cryotherapy   Informed consent: discussed and consent obtained   Timeout:  patient name, date of birth, surgical site, and procedure verified Lesion destroyed using liquid nitrogen: Yes   Region frozen until ice ball extended beyond lesion: Yes   Outcome: patient tolerated procedure well with no complications   Post-procedure details: wound care instructions given     Return if symptoms worsen or fail to improve, for WART.   Documentation: I have reviewed the above documentation for accuracy and completeness, and I  agree with the above.  I, Shirron Maranda, CMA, am acting as scribe for [employer], DO.   Delon Lenis, DO

## 2024-10-29 NOTE — Patient Instructions (Addendum)

## 2024-11-10 ENCOUNTER — Encounter: Payer: Self-pay | Admitting: Dermatology

## 2024-11-10 NOTE — Telephone Encounter (Signed)
 We saw this pt together on 10/19/24   There still time for it to fall off but she should schedule another LN visit for early January to make sure it will fall off completely

## 2024-11-25 ENCOUNTER — Telehealth: Payer: Self-pay | Admitting: Dermatology

## 2024-11-25 ENCOUNTER — Ambulatory Visit: Admitting: Obstetrics

## 2024-11-25 NOTE — Telephone Encounter (Signed)
 Called Patient to have them scheduled on 12-29-2024 at 1:15 pm for a wart follow up appointment. Patient received LN2 treatment for the wart on 10-29-2024 states that it still has not fallen off. Dr. Alm advised setting up an appointment for her in January. Patient is aware of appointment date and time.

## 2024-12-23 ENCOUNTER — Ambulatory Visit: Admitting: Dermatology

## 2024-12-29 ENCOUNTER — Encounter: Payer: Self-pay | Admitting: Dermatology

## 2024-12-29 ENCOUNTER — Other Ambulatory Visit: Payer: Self-pay

## 2024-12-29 ENCOUNTER — Ambulatory Visit: Admitting: Dermatology

## 2024-12-29 DIAGNOSIS — B079 Viral wart, unspecified: Secondary | ICD-10-CM

## 2024-12-29 DIAGNOSIS — Z30011 Encounter for initial prescription of contraceptive pills: Secondary | ICD-10-CM

## 2024-12-29 MED ORDER — NORGESTIMATE-ETH ESTRADIOL 0.25-35 MG-MCG PO TABS: 1.0000 | ORAL_TABLET | Freq: Every day | ORAL | 3 refills | Status: AC

## 2024-12-29 NOTE — Progress Notes (Signed)
 Rx sent for birth control refills x3 - patient needs annual to continue receiving.

## 2024-12-29 NOTE — Progress Notes (Unsigned)
" ° °  Follow-Up Visit  Patient (and/or pt guardian) consented to the use of AI-assisted tools for note generation.    Subjective  Kendra Young is a 24 y.o. female who presents for the following: wart at left inner eye  Patient was last evaluated on 10/29/24.  At this visit patient was treated with cryotherapy Patient reports sxs are unchanged.  Patient reports that after cryotherapy, most of the wart fell off but has since grown back Patient reports area is tender to the touch Patient denies medication changes. Patient reports she is sometimes using Tea tree oil on area  The following portions of the chart were reviewed this encounter and updated as appropriate: medications, allergies, medical history  Review of Systems:  No other skin or systemic complaints except as noted in HPI or Assessment and Plan.  Objective  Well appearing patient in no apparent distress; mood and affect are within normal limits.  A focused examination was performed of the following areas: Left inner eye  Relevant exam findings are noted in the Assessment and Plan.      Assessment & Plan  WART Exam: verrucous papule(s)  Counseling Discussed viral / HPV (Human Papilloma Virus) etiology and risk of spread /infectivity to other areas of body as well as to other people.  Multiple treatments and methods may be required to clear warts and it is possible treatment may not be successful.  Treatment risks include discoloration; scarring and there is still potential for wart recurrence.  Treatment Plan: Advised to start taking Cimetidine  Advised to stop using Tea tree oil  Destruction Procedure Note Destruction method: cryotherapy   Informed consent: discussed and consent obtained   Lesion destroyed using liquid nitrogen: Yes   Outcome: patient tolerated procedure well with no complications   Post-procedure details: wound care instructions given   Locations: left inner eye # of Lesions Treated: 1 with  4 cycles  Prior to procedure, discussed risks of blister formation, small wound, skin dyspigmentation, or rare scar following cryotherapy. Recommend Vaseline ointment to treated areas while healing.     FILIFORM WART Left Medial Canthus - Destruction of lesion - Left Medial Canthus  Return in about 6 months (around 06/28/2025) for wart f/u for cryotherapy treatment.  LILLETTE Kendra Young, am acting as a neurosurgeon for [employer], DO .   Documentation: I have reviewed the above documentation for accuracy and completeness, and I agree with the above.  Delon Lenis, DO   "

## 2024-12-29 NOTE — Patient Instructions (Addendum)

## 2025-02-16 ENCOUNTER — Ambulatory Visit: Admitting: Obstetrics

## 2025-02-19 ENCOUNTER — Ambulatory Visit: Admitting: Dermatology
